# Patient Record
Sex: Female | Born: 1970
Health system: Southern US, Community
[De-identification: ages and names within clinical notes are randomized; demographics above are authoritative.]

## PROBLEM LIST (undated history)

## (undated) DIAGNOSIS — R000 Tachycardia, unspecified: Secondary | ICD-10-CM

## (undated) DIAGNOSIS — K589 Irritable bowel syndrome without diarrhea: Secondary | ICD-10-CM

## (undated) HISTORY — PX: TONSILLECTOMY: SUR1361

## (undated) HISTORY — PX: WISDOM TOOTH EXTRACTION: SHX21

---

## 1998-07-14 ENCOUNTER — Other Ambulatory Visit: Admission: RE | Admit: 1998-07-14 | Discharge: 1998-07-14 | Payer: Self-pay | Admitting: Obstetrics & Gynecology

## 1999-07-20 ENCOUNTER — Other Ambulatory Visit: Admission: RE | Admit: 1999-07-20 | Discharge: 1999-07-20 | Payer: Self-pay | Admitting: Obstetrics & Gynecology

## 1999-10-03 ENCOUNTER — Emergency Department (HOSPITAL_COMMUNITY): Admission: EM | Admit: 1999-10-03 | Discharge: 1999-10-03 | Payer: Self-pay | Admitting: Emergency Medicine

## 2000-08-15 ENCOUNTER — Other Ambulatory Visit: Admission: RE | Admit: 2000-08-15 | Discharge: 2000-08-15 | Payer: Self-pay | Admitting: Obstetrics & Gynecology

## 2000-12-02 ENCOUNTER — Other Ambulatory Visit: Admission: RE | Admit: 2000-12-02 | Discharge: 2000-12-02 | Payer: Self-pay | Admitting: Obstetrics & Gynecology

## 2001-11-28 ENCOUNTER — Ambulatory Visit (HOSPITAL_COMMUNITY): Admission: RE | Admit: 2001-11-28 | Discharge: 2001-11-28 | Payer: Self-pay | Admitting: Obstetrics & Gynecology

## 2001-11-28 ENCOUNTER — Encounter: Payer: Self-pay | Admitting: Obstetrics & Gynecology

## 2001-12-04 ENCOUNTER — Inpatient Hospital Stay (HOSPITAL_COMMUNITY): Admission: AD | Admit: 2001-12-04 | Discharge: 2001-12-04 | Payer: Self-pay | Admitting: Obstetrics & Gynecology

## 2002-05-13 ENCOUNTER — Other Ambulatory Visit: Admission: RE | Admit: 2002-05-13 | Discharge: 2002-05-13 | Payer: Self-pay | Admitting: Obstetrics & Gynecology

## 2002-09-23 ENCOUNTER — Inpatient Hospital Stay (HOSPITAL_COMMUNITY): Admission: AD | Admit: 2002-09-23 | Discharge: 2002-09-26 | Payer: Self-pay | Admitting: Obstetrics & Gynecology

## 2002-09-25 ENCOUNTER — Encounter: Payer: Self-pay | Admitting: Obstetrics and Gynecology

## 2002-09-30 ENCOUNTER — Encounter: Payer: Self-pay | Admitting: Obstetrics & Gynecology

## 2002-09-30 ENCOUNTER — Ambulatory Visit (HOSPITAL_COMMUNITY): Admission: RE | Admit: 2002-09-30 | Discharge: 2002-09-30 | Payer: Self-pay | Admitting: Obstetrics & Gynecology

## 2002-10-06 ENCOUNTER — Ambulatory Visit (HOSPITAL_COMMUNITY): Admission: RE | Admit: 2002-10-06 | Discharge: 2002-10-06 | Payer: Self-pay | Admitting: Obstetrics & Gynecology

## 2002-10-06 ENCOUNTER — Encounter: Payer: Self-pay | Admitting: Obstetrics & Gynecology

## 2002-10-06 ENCOUNTER — Encounter: Admission: RE | Admit: 2002-10-06 | Discharge: 2002-10-06 | Payer: Self-pay | Admitting: Obstetrics & Gynecology

## 2002-10-06 ENCOUNTER — Inpatient Hospital Stay (HOSPITAL_COMMUNITY): Admission: AD | Admit: 2002-10-06 | Discharge: 2002-10-08 | Payer: Self-pay | Admitting: Obstetrics and Gynecology

## 2002-10-08 ENCOUNTER — Encounter: Payer: Self-pay | Admitting: Obstetrics and Gynecology

## 2003-06-02 ENCOUNTER — Other Ambulatory Visit: Admission: RE | Admit: 2003-06-02 | Discharge: 2003-06-02 | Payer: Self-pay | Admitting: Obstetrics & Gynecology

## 2004-06-27 ENCOUNTER — Other Ambulatory Visit: Admission: RE | Admit: 2004-06-27 | Discharge: 2004-06-27 | Payer: Self-pay | Admitting: Obstetrics & Gynecology

## 2006-11-26 ENCOUNTER — Encounter: Admission: RE | Admit: 2006-11-26 | Discharge: 2006-11-26 | Payer: Self-pay | Admitting: Gastroenterology

## 2009-06-09 ENCOUNTER — Encounter: Admission: RE | Admit: 2009-06-09 | Discharge: 2009-06-09 | Payer: Self-pay | Admitting: Obstetrics & Gynecology

## 2009-06-19 ENCOUNTER — Emergency Department (HOSPITAL_COMMUNITY): Admission: EM | Admit: 2009-06-19 | Discharge: 2009-06-19 | Payer: Self-pay | Admitting: Emergency Medicine

## 2009-06-19 ENCOUNTER — Encounter: Payer: Self-pay | Admitting: Internal Medicine

## 2009-08-01 DIAGNOSIS — I471 Supraventricular tachycardia, unspecified: Secondary | ICD-10-CM | POA: Insufficient documentation

## 2009-08-02 ENCOUNTER — Ambulatory Visit: Payer: Self-pay | Admitting: Internal Medicine

## 2010-05-30 NOTE — Assessment & Plan Note (Signed)
Summary: nep/svt/eval for ablation   Referring Provider:  Gordy Levan  CC:  new patient/SVT/evaluation for ablation.  Pt states she is feeling well and has no complaints at this time.Alexis Herman  History of Present Illness: Mrs. Langelier is seen at the request of Dr. Anne Fu for recurrent supraventricular tachycardia. These episodes date back 10-15 years. They're abrupt in onset and offset and are associated with shortness of breath and lightheadedness primarily related to her long duration. They can last up to 10 hours. They are interestingly frog negative and diuretic negative. There are no obvious precipitanting activities or factors. She notes no effect from caffeine. She is trichrome sinus massage and other vagal maneuvers without success.  Evaluation has included an echo that was norma; l emergency room records and Dr. Jayme Cloud his records were reviewed.  Current Medications (verified): 1)  None  Allergies (verified): No Known Drug Allergies  Past History:  Social History: Last updated: 08/02/2009 Tobacco Use - No.  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 HEENT  normal .  Neck veins were flat; carotids brisk and full without bruits. No lymphadenopathy. Back without kyphosis. Lungs clear. Heart sounds regular without murmurs or gallops. it was a split S1.PMI nondisplaced. Abdomen soft with active bowel sounds without midline pulsation or hepatomegaly. Femoral pulses and distal pulses intact. Extremities were without clubbing cyanosis or edemaSkin warm and dry. affect is normalNeurological exam grossly normal    EKG  Procedure date:  08/02/2009  Findings:      sinus rhythm intervals 0.12/0.07/0.37 no delta wave Intervals 0.03/2006/23 7 Axis of 70  Impression & Recommendations:  Problem # 1:  SUPRAVENTRICULAR TACHYCARDIA (ICD-427.89) The patient has supraventricular tachycardia, probably AV node reentry based on her cardiogram and gender although her symptoms are not consistent with that. We have discussed treatment options including p.r.n. versus daily AV nodal blocking agents, and catheter ablation. We discussed potential benefits of the former as well as her concerns with her low blood pressure and we discussed the potential benefits and risks of catheter ablation including but not limited to death perforation stroke heart block requiring pacemaker implantation. She understands these risks and she will be considering what it is that she would like to do.  Like to look at the echo cardiogram to see what the explanation is upper loud split first heart sound Orders: EKG w/ Interpretation (93000)

## 2010-07-19 LAB — URINALYSIS, ROUTINE W REFLEX MICROSCOPIC
Bilirubin Urine: NEGATIVE
Hgb urine dipstick: NEGATIVE
Nitrite: NEGATIVE
Protein, ur: NEGATIVE mg/dL
Specific Gravity, Urine: 1.012 (ref 1.005–1.030)
Urobilinogen, UA: 0.2 mg/dL (ref 0.0–1.0)

## 2010-07-19 LAB — RAPID URINE DRUG SCREEN, HOSP PERFORMED
Amphetamines: NOT DETECTED
Opiates: NOT DETECTED
Tetrahydrocannabinol: NOT DETECTED

## 2010-07-19 LAB — POCT I-STAT, CHEM 8
Chloride: 106 mEq/L (ref 96–112)
HCT: 42 % (ref 36.0–46.0)
Hemoglobin: 14.3 g/dL (ref 12.0–15.0)
Potassium: 3.7 mEq/L (ref 3.5–5.1)
Sodium: 138 mEq/L (ref 135–145)

## 2010-07-19 LAB — POCT PREGNANCY, URINE: Preg Test, Ur: NEGATIVE

## 2010-09-15 NOTE — Discharge Summary (Signed)
NAME:  Alexis Herman, Alexis Herman                     ACCOUNT NO.:  [id number]   MEDICAL RECORD NO.:  [id number]                   PATIENT TYPE:  INP   LOCATION:  9159                                 FACILITY:  [facility]   PHYSICIAN:  Miguel Aschoff, M.D.                    DATE OF BIRTH:  [birth year]   DATE OF ADMISSION:  09/23/2002  DATE OF DISCHARGE:  09/26/2002                                 DISCHARGE SUMMARY   ADMISSION DIAGNOSIS:  Intrauterine twin pregnancy at 29 weeks with  threatened preterm labor.   FINAL DIAGNOSIS:  Intrauterine twin pregnancy at 29 weeks with threatened  preterm labor.   PROCEDURES:  Magnesium sulfate tocolytic therapy, betamethasone  administration.   OTHER DIAGNOSIS:  Discordant fetal growth.   BRIEF HISTORY:  The patient is a 40 year old white female, G1, P0, with an  estimated date of confinement of 12/09/02.  The patient conceived this twin  gestation by in vitro fertilization and had been doing well until she  developed the onset of uterine contractions.  She was evaluated on 09/24/02  and was found to be having contractions every 3-4 minutes and was admitted  to the hospital to undergo tocolytic therapy.  The patient was started on  magnesium sulfate 2.5 gm per hour.  She was placed on Unasyn.  Group B strep  was obtained.  The patient did have arrest of her contractions on 2.5 gm of  magnesium sulfate and was eventually weaned off the magnesium sulfate and  changed to oral Procardia that required 20 mg q.8h. to keep her contractions  suppressed.   Laboratory studies while in the hospital revealed a negative group B strep  probe.  Fetal fibronectin was negative.  An obstetric ultrasound was carried  out which revealed a twin gestation with baby A presenting as a breech with  an estimated fetal weight of 1246 gm.  Fetus B5 was also presenting as a  breech with an estimated fetal weight of 1664 gm.  It was noted that this  was consistent with discordant growth.   Other parameters appear to be within  normal limits.  The patient's physical exam revealed a cervical length of  3.4 cm on ultrasound, and appeared to be closed on pelvic examination.   With cessation of her contractions with oral Procardia, she was stable  enough to be discharged home on 09/26/02.  She is to have her next  appointment on Wednesday, 09/30/02.  She is to call if there are any problems  such as fever, pain, rupture of membranes, or more than six contractions per  hour.  The patient will require subsequent fetal ultrasound studies and  Doppler studies to ensure that the discordant growth is not causing any  impairment to the smaller of the two fetuses.  Miguel Aschoff, M.D.    AR/MEDQ  D:  09/26/2002  T:  09/26/2002  Job:  [id number]

## 2010-09-15 NOTE — H&P (Signed)
[place]  Patient:    Alexis Herman, Alexis Herman                   MRN: [id number] Adm. Date:  [id number] Attending:  Osvaldo Human Dictator:   Anselm Lis, N.P. CC:         Miguel Aschoff, M.D./[employer] [facility]             Francisca December, M.D./in clinic                         History and Physical  HISTORY OF PRESENT ILLNESS:  Ms. Wilhemina Herman is a very pleasant 40 year old without  significant previous medical history, who developed episodes of rapid heart rate with associated light-headedness, first noted three months earlier.  The episodes usually lasted ten minutes; resolved spontaneously.  No particular pattern or precipitating factors identified - initially occurring about every one to two weeks and now occurring on a daily basis.  After lunch today, she had an occurrence that was sustained; she had associated  dizziness with some mild anterior chest pressure with exhalation noted.  She presented to [facility] emergency room where her heart rate was noted to be 150. She was given IV Adenosine - without breaking the SVT; actually, it increased to approximately 160s.  Decreased rate to sinus tach after 20 mg IV Cardizem - rate approximately 110.  Subsequently, heart rate maintaining 80s after IV Lopressor 5 mg.  EKG without ischemic changes.  iSTAT ___ significant for potassium of 3.3;  supplemented with 40 mEq of K-Dur.  The patient did have a Holter monitor placed approximately two weeks earlier; had one mild episode at that time - tape is not yet turned in.  PAST MEDICAL HISTORY: 1. Palpitations, as above. 2. Tonsillectomy, repair of deviated nasal septum, sinus surgery - in 1991. 3. History of colitis.  Denies history of: hypertension, diabetes mellitus, thyroid disease or cancer.  ALLERGIES:  No known drug allergies; no problems with seafood, shellfish nor iodine products.  MEDICATIONS:  Ortho Tri-Cyclen.  SOCIAL  HISTORY/HABITS:  Married for nine months; no children.  Tobacco: Negative. ETOH: Rare social.  Caffeine:  Decaf tea at home and decaffeinated soda at lunch. The patient works at [employer], Education officer, museum [employer]  FAMILY HISTORY:  Father age 42; mother age 93 - both alive and well.  One sister and one brother in good health.  REVIEW OF SYSTEMS:  As in HPI/Previous Medical History; otherwise - essentially  benign.  No complaint of undue headaches, tinnitus, dysphagia, melena nor constipation.  Episodic diarrhea.  Denies DOE, though does note shortness of breath with climbing long stairs at her new job function.  Negative pedal edema, orthopnea, PND nor undue shortness of breath.  PHYSICAL EXAMINATION:  GENERAL:  A well-nourished, pleasantly conversant young female in no apparent distress.  Husband in attendance.  VITAL SIGNS:  Blood pressure 116/80, heart rate (initially 149) currently in the 80s/sinus tach after IV Lopressor; saturations 100%  HEAD, EARS, EYES, NOSE AND THROAT:  Brisk bilateral carotid upstroke, without bruit.  NECK:  No significant JVD or thyromegaly.  CHEST:  Lung sounds clear with equal bilateral excursion.  CARDIAC:  Regular rate and rhythm without murmur, rub nor gallop; split S1 and normal S2.  ABDOMEN:  Soft, nondistended; normoactive bowel sounds.  Negative abdominal aorta, renal nor femoral bruit.  No masses, no organomegaly appreciated.  EXTREMITIES:  +2/4 bilateral radial,  femoral, dorsalis pedis and posterior tibial pulses.  Negative pedal edema.  NEUROLOGIC:  Cranial nerves II-XII grossly intact; alert and oriented x 3.  GENITO/RECTAL:  Exams deferred.  LABORATORY TESTS:  Today - iSTAT ___:  sodium 146, K 3.3 (supplemented), BUN 9,  glucose 86.  Hemoglobin 14, hematocrit 40.  EKG:  Initial supraventricular tachycardia at 149 beats/minute; subsequently sinus rhythm in the 80s after a total of IV Cardizem 20 mg and IV  Lopressor 5 mg.  IMPRESSION:  Supraventricular tachycardia; probable AV nodal re-entry in this 40 year old of benign previous medical history.  She now is in normal sinus rhythm with good rate control after intravenous calcium channel blocker and intravenous beta-blocker.  PLAN: 1. Will discharge from emergency room on Toprol 50 mg p.o. q.d. 2. Have scheduled for outpatient 2D-echocardiogram this Thursday 10/05/99 at 2 .m.    to evaluate for structural abnormalities. 3. Will follow-up TSH results, drawn here in the ER and which are pending. 4. Follow-up results of event monitor tape. 5. Patient has been counseled to avoid caffeinated beverages, alcoholic beverages    nor over-the-counter decongestant. DD:  10/03/99 TD:  10/03/99 Job: [id number]

## 2011-05-08 ENCOUNTER — Other Ambulatory Visit: Payer: Self-pay | Admitting: Obstetrics & Gynecology

## 2011-05-08 DIAGNOSIS — Z1231 Encounter for screening mammogram for malignant neoplasm of breast: Secondary | ICD-10-CM

## 2011-06-04 ENCOUNTER — Ambulatory Visit
Admission: RE | Admit: 2011-06-04 | Discharge: 2011-06-04 | Disposition: A | Discharge status: Home / Self Care | Payer: 59 | Source: Ambulatory Visit | Attending: Obstetrics & Gynecology | Admitting: Obstetrics & Gynecology

## 2011-06-04 DIAGNOSIS — Z1231 Encounter for screening mammogram for malignant neoplasm of breast: Secondary | ICD-10-CM

## 2011-06-11 ENCOUNTER — Other Ambulatory Visit: Payer: Self-pay | Admitting: Obstetrics & Gynecology

## 2011-06-11 DIAGNOSIS — R928 Other abnormal and inconclusive findings on diagnostic imaging of breast: Secondary | ICD-10-CM

## 2011-06-15 ENCOUNTER — Ambulatory Visit
Admission: RE | Admit: 2011-06-15 | Discharge: 2011-06-15 | Disposition: A | Discharge status: Home / Self Care | Payer: 59 | Source: Ambulatory Visit | Attending: Obstetrics & Gynecology | Admitting: Obstetrics & Gynecology

## 2011-06-15 DIAGNOSIS — R928 Other abnormal and inconclusive findings on diagnostic imaging of breast: Secondary | ICD-10-CM

## 2011-11-06 ENCOUNTER — Other Ambulatory Visit: Payer: Self-pay | Admitting: Obstetrics & Gynecology

## 2011-11-06 DIAGNOSIS — N6009 Solitary cyst of unspecified breast: Secondary | ICD-10-CM

## 2011-12-14 ENCOUNTER — Other Ambulatory Visit: Payer: 59

## 2011-12-17 ENCOUNTER — Ambulatory Visit
Admission: RE | Admit: 2011-12-17 | Discharge: 2011-12-17 | Disposition: A | Discharge status: Home / Self Care | Payer: 59 | Source: Ambulatory Visit | Attending: Obstetrics & Gynecology | Admitting: Obstetrics & Gynecology

## 2011-12-17 DIAGNOSIS — N6009 Solitary cyst of unspecified breast: Secondary | ICD-10-CM

## 2012-03-07 ENCOUNTER — Emergency Department
Admission: EM | Admit: 2012-03-07 | Discharge: 2012-03-07 | Disposition: A | Discharge status: Home / Self Care | Payer: Self-pay | Source: Home / Self Care

## 2012-03-07 ENCOUNTER — Encounter: Payer: Self-pay | Admitting: *Deleted

## 2012-03-07 ENCOUNTER — Emergency Department (INDEPENDENT_AMBULATORY_CARE_PROVIDER_SITE_OTHER): Payer: 59

## 2012-03-07 DIAGNOSIS — J40 Bronchitis, not specified as acute or chronic: Secondary | ICD-10-CM

## 2012-03-07 DIAGNOSIS — R059 Cough, unspecified: Secondary | ICD-10-CM

## 2012-03-07 DIAGNOSIS — R05 Cough: Secondary | ICD-10-CM

## 2012-03-07 DIAGNOSIS — R062 Wheezing: Secondary | ICD-10-CM

## 2012-03-07 DIAGNOSIS — J069 Acute upper respiratory infection, unspecified: Secondary | ICD-10-CM

## 2012-03-07 MED ORDER — AZITHROMYCIN 250 MG PO TABS: ORAL_TABLET | ORAL | Status: DC

## 2012-03-07 MED ORDER — HYDROCOD POLST-CHLORPHEN POLST 10-8 MG/5ML PO LQCR: 5.0000 mL | Freq: Two times a day (BID) | ORAL | Status: DC | PRN

## 2012-03-07 NOTE — ED Notes (Signed)
Patient reports URI last week. Unable to rid herself of the cough. Productive at times. Denies fever. Cough is worse at night.

## 2012-03-07 NOTE — ED Provider Notes (Signed)
History     CSN: [id number]  Arrival date & time 03/07/12  0847   First MD Initiated Contact with Patient 03/07/12 0914      Chief Complaint  Patient presents with  . Cough   HPI URI Symptoms Onset: 10 days  Description: initially with URI sxs including rhinorrhea, nasal congestion, sore throat and cough. Cough and drainage has persisted Modifying factors:  Also with mild wheezing and SOB. No known prior hx/o asthma   Symptoms Nasal discharge: yes Fever: no Sore throat: no Cough: yes Wheezing: yes Ear pain: n GI symptoms: no Sick contacts: yes  Red Flags  Stiff neck: no Dyspnea: mild Rash: no Swallowing difficulty: no  Sinusitis Risk Factors Headache/face pain: no Double sickening: no tooth pain: no  Allergy Risk Factors Sneezing: no Itchy scratchy throat: no Seasonal symptoms: yes  Flu Risk Factors Headache: no muscle aches: no severe fatigue: no   History reviewed. No pertinent past medical history.  Past Surgical History  Procedure Date  . Cesarean section   . Tonsillectomy     Family History  Problem Relation Age of Onset  . Cancer Mother     breast  . COPD Father     History  Substance Use Topics  . Smoking status: Never Smoker   . Smokeless tobacco: Not on file  . Alcohol Use: No    OB History    Grav Para Term Preterm Abortions TAB SAB Ect Mult Living                  Review of Systems  All other systems reviewed and are negative.    Allergies  Review of patient's allergies indicates no known allergies.  Home Medications   Current Outpatient Rx  Name  Route  Sig  Dispense  Refill  . AZITHROMYCIN 250 MG PO TABS      Take 2 tabs PO x 1 dose, then 1 tab PO QD x 4 days   6 tablet   0   . HYDROCOD POLST-CPM POLST ER 10-8 MG/5ML PO LQCR   Oral   Take 5 mLs by mouth every 12 (twelve) hours as needed.   140 mL   0     BP 110/69  Pulse 70  Temp 98.1 F (36.7 C) (Oral)  Resp 14  Ht 5\' 4"  (1.626 m)  Wt 143 lb  (64.864 kg)  BMI 24.55 kg/m2  SpO2 100%  LMP 02/21/2012  Physical Exam  Constitutional: She appears well-developed and well-nourished.  HENT:  Head: Normocephalic and atraumatic.  Right Ear: External ear normal.  Left Ear: External ear normal.       +nasal erythema, rhinorrhea bilaterally, + post oropharyngeal erythema    Eyes: Conjunctivae normal are normal. Pupils are equal, round, and reactive to light.  Neck: Normal range of motion. Neck supple.  Cardiovascular: Normal rate, regular rhythm and normal heart sounds.   Pulmonary/Chest: Effort normal and breath sounds normal. She has no wheezes.  Abdominal: Soft.  Musculoskeletal: Normal range of motion.  Lymphadenopathy:    She has no cervical adenopathy.  Neurological: She is alert.  Skin: Skin is warm.    ED Course  Procedures (including critical care time)  Labs Reviewed - No data to display Dg Chest 2 View  03/07/2012  *RADIOLOGY REPORT*  Clinical Data: Cough.  CHEST - 2 VIEW  Comparison: None  Findings: The cardiac silhouette, mediastinal and hilar contours are within normal limits.  Mild peribronchial thickening and slight hyperinflation could  suggest bronchitis or reactive airways disease.  No focal infiltrates or pleural effusion.  The bony thorax is intact.  IMPRESSION: Mild bronchitic type lung changes and slight hyperinflation could suggest bronchitis or reactive airways disease.   Original Report Authenticated By: Rudie Meyer, M.D.      1. URI (upper respiratory infection)   2. Bronchitis   3. Wheezing       MDM  Solumedrol 125mg  IM x1 in setting of wheezing.  Zpak for atypical coverage.  Tussionex for cough.  Zyrtec for allergic component.  Discussed resp and infectious red flags for reevaluation.  Follow up with PCP next week for general reevaluation.  Pt would benefit from formal PFTs 4-6 after resolution of sxs.     The patient and/or caregiver has been counseled thoroughly with regard to  treatment plan and/or medications prescribed including dosage, schedule, interactions, rationale for use, and possible side effects and they verbalize understanding. Diagnoses and expected course of recovery discussed and will return if not improved as expected or if the condition worsens. Patient and/or caregiver verbalized understanding.             Doree Albee, MD 03/07/12 1006

## 2012-05-19 ENCOUNTER — Other Ambulatory Visit: Payer: Self-pay | Admitting: Obstetrics & Gynecology

## 2012-05-19 DIAGNOSIS — Z1231 Encounter for screening mammogram for malignant neoplasm of breast: Secondary | ICD-10-CM

## 2012-06-11 ENCOUNTER — Ambulatory Visit: Payer: 59

## 2012-06-18 ENCOUNTER — Ambulatory Visit: Payer: 59

## 2012-06-25 ENCOUNTER — Ambulatory Visit
Admission: RE | Admit: 2012-06-25 | Discharge: 2012-06-25 | Disposition: A | Discharge status: Home / Self Care | Payer: BC Managed Care – PPO | Source: Ambulatory Visit | Attending: Obstetrics & Gynecology | Admitting: Obstetrics & Gynecology

## 2012-06-25 DIAGNOSIS — Z1231 Encounter for screening mammogram for malignant neoplasm of breast: Secondary | ICD-10-CM

## 2012-10-26 ENCOUNTER — Emergency Department (HOSPITAL_BASED_OUTPATIENT_CLINIC_OR_DEPARTMENT_OTHER): Payer: BC Managed Care – PPO

## 2012-10-26 ENCOUNTER — Encounter (HOSPITAL_BASED_OUTPATIENT_CLINIC_OR_DEPARTMENT_OTHER): Payer: Self-pay

## 2012-10-26 ENCOUNTER — Emergency Department (HOSPITAL_BASED_OUTPATIENT_CLINIC_OR_DEPARTMENT_OTHER)
Admission: EM | Admit: 2012-10-26 | Discharge: 2012-10-27 | Disposition: A | Discharge status: Home / Self Care | Payer: BC Managed Care – PPO | Attending: Emergency Medicine | Admitting: Emergency Medicine

## 2012-10-26 DIAGNOSIS — Z8679 Personal history of other diseases of the circulatory system: Secondary | ICD-10-CM | POA: Insufficient documentation

## 2012-10-26 DIAGNOSIS — R109 Unspecified abdominal pain: Secondary | ICD-10-CM

## 2012-10-26 DIAGNOSIS — R1084 Generalized abdominal pain: Secondary | ICD-10-CM | POA: Insufficient documentation

## 2012-10-26 DIAGNOSIS — Z3202 Encounter for pregnancy test, result negative: Secondary | ICD-10-CM | POA: Insufficient documentation

## 2012-10-26 HISTORY — DX: Tachycardia, unspecified: R00.0

## 2012-10-26 HISTORY — DX: Irritable bowel syndrome, unspecified: K58.9

## 2012-10-26 LAB — URINALYSIS, ROUTINE W REFLEX MICROSCOPIC
Bilirubin Urine: NEGATIVE
Leukocytes, UA: NEGATIVE
Nitrite: NEGATIVE
Specific Gravity, Urine: 1.014 (ref 1.005–1.030)
Urobilinogen, UA: 0.2 mg/dL (ref 0.0–1.0)
pH: 5.5 (ref 5.0–8.0)

## 2012-10-26 LAB — BASIC METABOLIC PANEL
Calcium: 9.6 mg/dL (ref 8.4–10.5)
GFR calc Af Amer: >90 mL/min (ref >90)
GFR calc non Af Amer: >90 mL/min (ref >90)
Glucose, Bld: 86 mg/dL (ref 70–99)
Potassium: 3.5 mEq/L (ref 3.5–5.1)
Sodium: 138 mEq/L (ref 135–145)

## 2012-10-26 LAB — CBC
MCH: 30.3 pg (ref 26.0–34.0)
Platelets: 232 10*3/uL (ref 150–400)
RBC: 4.29 MIL/uL (ref 3.87–5.11)

## 2012-10-26 LAB — PREGNANCY, URINE: Preg Test, Ur: NEGATIVE

## 2012-10-26 MED ORDER — IOHEXOL 300 MG/ML  SOLN
50.0000 mL | Freq: Once | INTRAMUSCULAR | Status: AC | PRN
  Administered 2012-10-26: 50 mL via ORAL

## 2012-10-26 NOTE — ED Notes (Signed)
Patient here with generalized abdominal pain since Friday. Originally thought the pain was cramps but now the pain is more constant and today bloated. Last bowel movement yesterday and no nausea. Has IBS but reports that this pain is different

## 2012-10-26 NOTE — ED Provider Notes (Signed)
History    This chart was scribed for Rolan Bucco, MD, MD by Ashley Jacobs, ED Scribe. The patient was seen in room MH05/MH05 and the patient's care was started at 9:52 PM  CSN: [id number] Arrival date & time 10/26/12  2047    Chief Complaint  Patient presents with  . Abdominal Pain    The history is provided by the patient and medical records. No language interpreter was used.   HPI Comments: Alexis Herman is a 42 y.o. female who presents to the Emergency Department complaining of moderate, constant abdominal pain for the past 3 days. Pt reports that symptoms began 3-day ago and most likely due to IBS symptoms or menstrual period with a general "ache" feeling  across her abdomen and feeling "full" despite not having an appetite. Pt reports by day two the pain was worsening and localized to the lower abdomen and persist to arrival. Pt reports that pain worsen by movement and is progressively worsen throughout the day. She recalls having small BM this morning and gas throughout today. Pt has an hx of c-section in 2004. She reports not seeing a GI physician recently. Pt denies fever, chills, nausea, vomiting, diarrhea, weakness, cough, SOB and any other pain.   Past Medical History  Diagnosis Date  . Irritable bowel syndrome (IBS)   . Tachycardia    Past Surgical History  Procedure Laterality Date  . Cesarean section    . Tonsillectomy     Family History  Problem Relation Age of Onset  . Cancer Mother     breast  . COPD Father    History  Substance Use Topics  . Smoking status: Never Smoker   . Smokeless tobacco: Not on file  . Alcohol Use: No   OB History   Grav Para Term Preterm Abortions TAB SAB Ect Mult Living                 Review of Systems  Gastrointestinal: Positive for abdominal pain.    Allergies  Review of patient's allergies indicates no known allergies.  Home Medications  No current outpatient prescriptions on file. BP 116/72  Pulse 92   Temp(Src) 98.5 F (36.9 C) (Oral)  Resp 16  Ht 5\' 4"  (1.626 m)  Wt 135 lb (61.236 kg)  BMI 23.16 kg/m2  SpO2 100%  LMP 10/24/2012  Physical Exam  Nursing note and vitals reviewed. Constitutional: She is oriented to person, place, and time. She appears well-developed and well-nourished. No distress.  HENT:  Head: Normocephalic and atraumatic.  Eyes: EOM are normal. Pupils are equal, round, and reactive to light.  Neck: Normal range of motion. Neck supple. No tracheal deviation present.  Cardiovascular: Normal rate, regular rhythm and normal heart sounds.   Pulmonary/Chest: Effort normal and breath sounds normal. No respiratory distress. She has no wheezes. She has no rales. She exhibits no tenderness.  Abdominal: Soft. Bowel sounds are normal. There is no rebound and no guarding.  moderate diffuse abdominal tenderness Worse in the lower right quaderant No CVA tenderness  Musculoskeletal: Normal range of motion. She exhibits no edema.  Lymphadenopathy:    She has no cervical adenopathy.  Neurological: She is alert and oriented to person, place, and time.  Skin: Skin is warm and dry. No rash noted.  Psychiatric: She has a normal mood and affect. Her behavior is normal.    ED Course  Procedures (including critical care time) DIAGNOSTIC STUDIES: Oxygen Saturation is 100% on room air, normal by my  interpretation.    COORDINATION OF CARE: 9:58 PM Discussed ED treatment with pt and pt agrees.    Results for orders placed during the hospital encounter of 10/26/12  URINALYSIS, ROUTINE W REFLEX MICROSCOPIC      Result Value Range   Color, Urine YELLOW  YELLOW   APPearance CLEAR  CLEAR   Specific Gravity, Urine 1.014  1.005 - 1.030   pH 5.5  5.0 - 8.0   Glucose, UA NEGATIVE  NEGATIVE mg/dL   Hgb urine dipstick NEGATIVE  NEGATIVE   Bilirubin Urine NEGATIVE  NEGATIVE   Ketones, ur NEGATIVE  NEGATIVE mg/dL   Protein, ur NEGATIVE  NEGATIVE mg/dL   Urobilinogen, UA 0.2  0.0 - 1.0  mg/dL   Nitrite NEGATIVE  NEGATIVE   Leukocytes, UA NEGATIVE  NEGATIVE  PREGNANCY, URINE      Result Value Range   Preg Test, Ur NEGATIVE  NEGATIVE  BASIC METABOLIC PANEL      Result Value Range   Sodium 138  135 - 145 mEq/L   Potassium 3.5  3.5 - 5.1 mEq/L   Chloride 102  96 - 112 mEq/L   CO2 28  19 - 32 mEq/L   Glucose, Bld 86  70 - 99 mg/dL   BUN 12  6 - 23 mg/dL   Creatinine, Ser 1.61  0.50 - 1.10 mg/dL   Calcium 9.6  8.4 - 09.6 mg/dL   GFR calc non Af Amer >90  >90 mL/min   GFR calc Af Amer >90  >90 mL/min  CBC      Result Value Range   WBC 5.0  4.0 - 10.5 K/uL   RBC 4.29  3.87 - 5.11 MIL/uL   Hemoglobin 13.0  12.0 - 15.0 g/dL   HCT 04.5  40.9 - 81.1 %   MCV 88.6  78.0 - 100.0 fL   MCH 30.3  26.0 - 34.0 pg   MCHC 34.2  30.0 - 36.0 g/dL   RDW 91.4  78.2 - 95.6 %   Platelets 232  150 - 400 K/uL   No results found.   No results found. No diagnosis found.  MDM  PT is awaiting CT.  Will turn over to DR Palumbo pending CT  I personally performed the services described in this documentation, which was scribed in my presence.  The recorded information has been reviewed and considered.    Rolan Bucco, MD 10/27/12 0000

## 2012-10-27 ENCOUNTER — Encounter (HOSPITAL_BASED_OUTPATIENT_CLINIC_OR_DEPARTMENT_OTHER): Payer: Self-pay

## 2012-10-27 LAB — HEPATIC FUNCTION PANEL
ALT: 22 U/L (ref 0–35)
Albumin: 3.7 g/dL (ref 3.5–5.2)
Total Protein: 7 g/dL (ref 6.0–8.3)

## 2012-10-27 LAB — LIPASE, BLOOD: Lipase: 38 U/L (ref 11–59)

## 2012-10-27 MED ORDER — IOHEXOL 300 MG/ML  SOLN
100.0000 mL | Freq: Once | INTRAMUSCULAR | Status: AC | PRN
  Administered 2012-10-27: 100 mL via INTRAVENOUS

## 2012-10-27 MED ORDER — TRAMADOL HCL 50 MG PO TABS: 50.0000 mg | ORAL_TABLET | Freq: Four times a day (QID) | ORAL | Status: DC | PRN

## 2012-10-27 NOTE — ED Notes (Signed)
Patient transported to CT 

## 2012-10-27 NOTE — ED Notes (Signed)
rx x 1 given for tramadol

## 2013-05-21 ENCOUNTER — Other Ambulatory Visit: Payer: Self-pay

## 2013-05-21 DIAGNOSIS — Z1231 Encounter for screening mammogram for malignant neoplasm of breast: Secondary | ICD-10-CM

## 2013-06-26 ENCOUNTER — Ambulatory Visit: Payer: 59

## 2013-07-08 ENCOUNTER — Ambulatory Visit
Admission: RE | Admit: 2013-07-08 | Discharge: 2013-07-08 | Disposition: A | Discharge status: Home / Self Care | Payer: BC Managed Care – PPO | Source: Ambulatory Visit

## 2013-07-08 DIAGNOSIS — Z1231 Encounter for screening mammogram for malignant neoplasm of breast: Secondary | ICD-10-CM

## 2014-05-14 ENCOUNTER — Other Ambulatory Visit: Payer: Self-pay | Admitting: Obstetrics & Gynecology

## 2014-05-27 ENCOUNTER — Encounter (HOSPITAL_COMMUNITY): Payer: Self-pay

## 2014-05-27 ENCOUNTER — Encounter (HOSPITAL_COMMUNITY)
Admission: RE | Admit: 2014-05-27 | Discharge: 2014-05-27 | Disposition: A | Discharge status: Home / Self Care | Payer: BLUE CROSS/BLUE SHIELD | Source: Ambulatory Visit | Attending: Obstetrics & Gynecology | Admitting: Obstetrics & Gynecology

## 2014-05-27 DIAGNOSIS — Z01818 Encounter for other preprocedural examination: Secondary | ICD-10-CM | POA: Insufficient documentation

## 2014-05-27 DIAGNOSIS — R102 Pelvic and perineal pain: Secondary | ICD-10-CM | POA: Diagnosis not present

## 2014-05-27 LAB — CBC
HEMATOCRIT: 40.6 % (ref 36.0–46.0)
Hemoglobin: 13.5 g/dL (ref 12.0–15.0)
MCH: 29.6 pg (ref 26.0–34.0)
MCHC: 33.3 g/dL (ref 30.0–36.0)
MCV: 89 fL (ref 78.0–100.0)
PLATELETS: 223 10*3/uL (ref 150–400)
RBC: 4.56 MIL/uL (ref 3.87–5.11)
RDW: 12.3 % (ref 11.5–15.5)
WBC: 4.3 10*3/uL (ref 4.0–10.5)

## 2014-05-27 NOTE — Patient Instructions (Addendum)
   Your procedure is scheduled on:  Tuesday, Feb 2  [facility] at: 9:45 AM Pick up the phone at the desk and dial [phone] and inform us of your arrival.  Please call this number if you have any problems the morning of surgery: [phone]  Remember: Do not eat or drink after midnight: Monday Take these medicines the morning of surgery with a SIP OF WATER:  None  Do not wear jewelry, make-up, or FINGER nail polish No metal in your hair or on your body. Do not wear lotions, powders, perfumes.  You may wear deodorant.  Do not bring valuables to the hospital. Contacts, dentures or bridgework may not be worn into surgery.   Patients discharged on the day of surgery will not be allowed to drive home.  Home with husband Lloyd Hugereil cell [phone]

## 2014-06-01 ENCOUNTER — Ambulatory Visit (HOSPITAL_COMMUNITY): Payer: BLUE CROSS/BLUE SHIELD | Admitting: Anesthesiology

## 2014-06-01 ENCOUNTER — Encounter (HOSPITAL_COMMUNITY)
Admission: RE | Disposition: A | Discharge status: Home / Self Care | Payer: Self-pay | Source: Ambulatory Visit | Attending: Obstetrics & Gynecology

## 2014-06-01 ENCOUNTER — Ambulatory Visit (HOSPITAL_COMMUNITY)
Admission: RE | Admit: 2014-06-01 | Discharge: 2014-06-01 | Disposition: A | Discharge status: Home / Self Care | Payer: BLUE CROSS/BLUE SHIELD | Source: Ambulatory Visit | Attending: Obstetrics & Gynecology | Admitting: Obstetrics & Gynecology

## 2014-06-01 DIAGNOSIS — K589 Irritable bowel syndrome without diarrhea: Secondary | ICD-10-CM | POA: Insufficient documentation

## 2014-06-01 DIAGNOSIS — Z79899 Other long term (current) drug therapy: Secondary | ICD-10-CM | POA: Insufficient documentation

## 2014-06-01 DIAGNOSIS — N946 Dysmenorrhea, unspecified: Secondary | ICD-10-CM | POA: Diagnosis present

## 2014-06-01 HISTORY — PX: LAPAROSCOPY: SHX197

## 2014-06-01 LAB — PREGNANCY, URINE: PREG TEST UR: NEGATIVE

## 2014-06-01 SURGERY — LAPAROSCOPY OPERATIVE
Anesthesia: General | Site: Abdomen

## 2014-06-01 MED ORDER — KETOROLAC TROMETHAMINE 30 MG/ML IJ SOLN: 30.0000 mg | Freq: Once | INTRAMUSCULAR | Status: DC

## 2014-06-01 MED ORDER — OXYCODONE-ACETAMINOPHEN 5-325 MG PO TABS: 1.0000 | ORAL_TABLET | ORAL | Status: DC | PRN

## 2014-06-01 MED ORDER — FENTANYL CITRATE 0.05 MG/ML IJ SOLN
INTRAMUSCULAR | Status: AC
  Filled 2014-06-01: qty 5

## 2014-06-01 MED ORDER — IBUPROFEN 800 MG PO TABS
800.0000 mg | ORAL_TABLET | Freq: Three times a day (TID) | ORAL | Status: DC | PRN
  Administered 2014-06-01: 600 mg via ORAL

## 2014-06-01 MED ORDER — PROPOFOL 10 MG/ML IV BOLUS
INTRAVENOUS | Status: DC | PRN
  Administered 2014-06-01: 180 mg via INTRAVENOUS

## 2014-06-01 MED ORDER — MIDAZOLAM HCL 2 MG/2ML IJ SOLN
INTRAMUSCULAR | Status: AC
  Filled 2014-06-01: qty 2

## 2014-06-01 MED ORDER — HEPARIN SODIUM (PORCINE) 5000 UNIT/ML IJ SOLN
INTRAMUSCULAR | Status: AC
  Filled 2014-06-01: qty 1

## 2014-06-01 MED ORDER — LACTATED RINGERS IV SOLN
INTRAVENOUS | Status: DC
  Administered 2014-06-01 (×2): via INTRAVENOUS

## 2014-06-01 MED ORDER — DEXAMETHASONE SODIUM PHOSPHATE 10 MG/ML IJ SOLN
INTRAMUSCULAR | Status: DC | PRN
  Administered 2014-06-01: 4 mg via INTRAVENOUS

## 2014-06-01 MED ORDER — LIDOCAINE HCL (CARDIAC) 20 MG/ML IV SOLN
INTRAVENOUS | Status: DC | PRN
  Administered 2014-06-01: 50 mg via INTRAVENOUS

## 2014-06-01 MED ORDER — SUCCINYLCHOLINE CHLORIDE 20 MG/ML IJ SOLN
INTRAMUSCULAR | Status: DC | PRN
  Administered 2014-06-01: 100 mg via INTRAVENOUS

## 2014-06-01 MED ORDER — LIDOCAINE HCL (CARDIAC) 20 MG/ML IV SOLN
INTRAVENOUS | Status: AC
  Filled 2014-06-01: qty 5

## 2014-06-01 MED ORDER — LACTATED RINGERS IV SOLN: INTRAVENOUS | Status: DC

## 2014-06-01 MED ORDER — MIDAZOLAM HCL 2 MG/2ML IJ SOLN
INTRAMUSCULAR | Status: DC | PRN
  Administered 2014-06-01: 1 mg via INTRAVENOUS
  Administered 2014-06-01: 2 mg via INTRAVENOUS

## 2014-06-01 MED ORDER — BUPIVACAINE HCL (PF) 0.25 % IJ SOLN
INTRAMUSCULAR | Status: AC
  Filled 2014-06-01: qty 30

## 2014-06-01 MED ORDER — SCOPOLAMINE 1 MG/3DAYS TD PT72
MEDICATED_PATCH | TRANSDERMAL | Status: AC
  Administered 2014-06-01: 1.5 mg via TRANSDERMAL
  Filled 2014-06-01: qty 1

## 2014-06-01 MED ORDER — FENTANYL CITRATE 0.05 MG/ML IJ SOLN: 25.0000 ug | INTRAMUSCULAR | Status: DC | PRN

## 2014-06-01 MED ORDER — SUCCINYLCHOLINE CHLORIDE 20 MG/ML IJ SOLN
INTRAMUSCULAR | Status: AC
  Filled 2014-06-01: qty 10

## 2014-06-01 MED ORDER — ROCURONIUM BROMIDE 100 MG/10ML IV SOLN
INTRAVENOUS | Status: DC | PRN
  Administered 2014-06-01: 5 mg via INTRAVENOUS
  Administered 2014-06-01: 10 mg via INTRAVENOUS

## 2014-06-01 MED ORDER — BUPIVACAINE HCL 0.25 % IJ SOLN
INTRAMUSCULAR | Status: DC | PRN
  Administered 2014-06-01: 5 mL

## 2014-06-01 MED ORDER — 0.9 % SODIUM CHLORIDE (POUR BTL) OPTIME
TOPICAL | Status: DC | PRN
  Administered 2014-06-01: 1000 mL

## 2014-06-01 MED ORDER — PROPOFOL 10 MG/ML IV BOLUS
INTRAVENOUS | Status: AC
  Filled 2014-06-01: qty 20

## 2014-06-01 MED ORDER — IBUPROFEN 600 MG PO TABS
ORAL_TABLET | ORAL | Status: AC
  Filled 2014-06-01: qty 1

## 2014-06-01 MED ORDER — MENTHOL 3 MG MT LOZG
1.0000 | LOZENGE | OROMUCOSAL | Status: DC | PRN
  Filled 2014-06-01: qty 9

## 2014-06-01 MED ORDER — NEOSTIGMINE METHYLSULFATE 10 MG/10ML IV SOLN
INTRAVENOUS | Status: DC | PRN
  Administered 2014-06-01: 1 mg via INTRAVENOUS

## 2014-06-01 MED ORDER — HYDROMORPHONE HCL 1 MG/ML IJ SOLN: 0.2000 mg | INTRAMUSCULAR | Status: DC | PRN

## 2014-06-01 MED ORDER — GLYCOPYRROLATE 0.2 MG/ML IJ SOLN
INTRAMUSCULAR | Status: AC
  Filled 2014-06-01: qty 1

## 2014-06-01 MED ORDER — IBUPROFEN 600 MG PO TABS
600.0000 mg | ORAL_TABLET | Freq: Once | ORAL | Status: AC
  Administered 2014-06-01: 600 mg via ORAL

## 2014-06-01 MED ORDER — SIMETHICONE 80 MG PO CHEW
80.0000 mg | CHEWABLE_TABLET | Freq: Four times a day (QID) | ORAL | Status: DC | PRN
Start: 2014-06-01 — End: 2014-06-01
  Filled 2014-06-01: qty 1

## 2014-06-01 MED ORDER — FENTANYL CITRATE 0.05 MG/ML IJ SOLN
INTRAMUSCULAR | Status: DC | PRN
  Administered 2014-06-01 (×2): 50 ug via INTRAVENOUS
  Administered 2014-06-01: 100 ug via INTRAVENOUS
  Administered 2014-06-01: 50 ug via INTRAVENOUS

## 2014-06-01 MED ORDER — ONDANSETRON HCL 4 MG/2ML IJ SOLN
INTRAMUSCULAR | Status: DC | PRN
  Administered 2014-06-01: 4 mg via INTRAVENOUS

## 2014-06-01 MED ORDER — DEXAMETHASONE SODIUM PHOSPHATE 4 MG/ML IJ SOLN
INTRAMUSCULAR | Status: AC
  Filled 2014-06-01: qty 1

## 2014-06-01 MED ORDER — SCOPOLAMINE 1 MG/3DAYS TD PT72
1.0000 | MEDICATED_PATCH | Freq: Once | TRANSDERMAL | Status: DC
  Administered 2014-06-01: 1.5 mg via TRANSDERMAL

## 2014-06-01 MED ORDER — ONDANSETRON HCL 4 MG/2ML IJ SOLN
INTRAMUSCULAR | Status: AC
  Filled 2014-06-01: qty 2

## 2014-06-01 MED ORDER — GLYCOPYRROLATE 0.2 MG/ML IJ SOLN
INTRAMUSCULAR | Status: DC | PRN
  Administered 2014-06-01: 0.2 mg via INTRAVENOUS

## 2014-06-01 MED ORDER — NEOSTIGMINE METHYLSULFATE 10 MG/10ML IV SOLN
INTRAVENOUS | Status: AC
  Filled 2014-06-01: qty 1

## 2014-06-01 MED ORDER — IBUPROFEN 600 MG PO TABS: 600.0000 mg | ORAL_TABLET | Freq: Four times a day (QID) | ORAL | Status: DC | PRN

## 2014-06-01 SURGICAL SUPPLY — 33 items
BENZOIN TINCTURE PRP APPL 2/3 (GAUZE/BANDAGES/DRESSINGS) ×2 IMPLANT
CABLE HIGH FREQUENCY MONO STRZ (ELECTRODE) IMPLANT
CATH ROBINSON RED A/P 16FR (CATHETERS) IMPLANT
CLOTH BEACON ORANGE TIMEOUT ST (SAFETY) ×2 IMPLANT
DRSG COVADERM PLUS 2X2 (GAUZE/BANDAGES/DRESSINGS) ×4 IMPLANT
DRSG OPSITE POSTOP 3X4 (GAUZE/BANDAGES/DRESSINGS) ×2 IMPLANT
DURAPREP 26ML APPLICATOR (WOUND CARE) ×2 IMPLANT
GLOVE BIO SURGEON STRL SZ 6.5 (GLOVE) ×2 IMPLANT
GLOVE BIOGEL PI IND STRL 7.0 (GLOVE) ×2 IMPLANT
GLOVE BIOGEL PI INDICATOR 7.0 (GLOVE) ×2
GOWN STRL REUS W/TWL LRG LVL3 (GOWN DISPOSABLE) ×4 IMPLANT
LIGASURE 5MM LAPAROSCOPIC (INSTRUMENTS) IMPLANT
LIQUID BAND (GAUZE/BANDAGES/DRESSINGS) ×2 IMPLANT
NEEDLE INSUFFLATION 120MM (ENDOMECHANICALS) IMPLANT
NS IRRIG 1000ML POUR BTL (IV SOLUTION) ×2 IMPLANT
PACK LAPAROSCOPY BASIN (CUSTOM PROCEDURE TRAY) ×2 IMPLANT
PAD POSITIONER PINK NONSTERILE (MISCELLANEOUS) ×2 IMPLANT
POUCH SPECIMEN RETRIEVAL 10MM (ENDOMECHANICALS) IMPLANT
PROTECTOR NERVE ULNAR (MISCELLANEOUS) ×4 IMPLANT
SET IRRIG TUBING LAPAROSCOPIC (IRRIGATION / IRRIGATOR) IMPLANT
SLEEVE XCEL OPT CAN 5 100 (ENDOMECHANICALS) ×2 IMPLANT
SOLUTION ELECTROLUBE (MISCELLANEOUS) IMPLANT
STRIP CLOSURE SKIN 1/4X4 (GAUZE/BANDAGES/DRESSINGS) ×2 IMPLANT
SUT MNCRL AB 4-0 PS2 18 (SUTURE) ×2 IMPLANT
SUT MON AB 4-0 PS1 27 (SUTURE) ×2 IMPLANT
SUT VICRYL 0 UR6 27IN ABS (SUTURE) ×2 IMPLANT
SYR 5ML LL (SYRINGE) IMPLANT
TOWEL OR 17X24 6PK STRL BLUE (TOWEL DISPOSABLE) ×4 IMPLANT
TRAY FOLEY CATH 14FR (SET/KITS/TRAYS/PACK) IMPLANT
TROCAR XCEL NON-BLD 11X100MML (ENDOMECHANICALS) IMPLANT
TROCAR XCEL NON-BLD 5MMX100MML (ENDOMECHANICALS) ×2 IMPLANT
WARMER LAPAROSCOPE (MISCELLANEOUS) ×2 IMPLANT
WATER STERILE IRR 1000ML POUR (IV SOLUTION) ×2 IMPLANT

## 2014-06-01 NOTE — Anesthesia Postprocedure Evaluation (Signed)
  Anesthesia Post-op Note  Patient: Alexis BearsJennifer J Herman  Procedure(s) Performed: Procedure(s): LAPAROSCOPY OPERATIVE (N/A) Patient is awake and responsive. Pain and nausea are reasonably well controlled. Vital signs are stable and clinically acceptable. Oxygen saturation is clinically acceptable. There are no apparent anesthetic complications at this time. Patient is ready for discharge.

## 2014-06-01 NOTE — Anesthesia Preprocedure Evaluation (Signed)

## 2014-06-01 NOTE — H&P (Signed)
Alexis Herman is an 44 y.o. female G1P1002 presents as a consultation for possible surgery from Dr. Aldona Bar. Patient notes that for the past several years her dysmenorrhea has been progressively worsening. She reports that during her menses her "stomach gets bloated, and her abdomen is so tender no one can touch it". She notes that the pelvic cramps are constant and are debilitating the first 2 days of her menstrual cycle. Rest of the days of her cycle she denies pain. Her menses is regular q28 days lasts 5 days. The first few days are heavier than it used to be but bleeding is not her chief complaint.    Pertinent Gynecological History: Menses: flow is moderate Bleeding: normal Contraception: tubal ligation DES exposure: denies Blood transfusions: none Sexually transmitted diseases: no past history Previous GYN Procedures: DNC c-section Last mammogram: normal Date: 2015 Last pap: normal Date: 2015 OB History: G1, P2   Menstrual History: Menarche age: 44  No LMP recorded.    Past Medical History  Diagnosis Date  . Irritable bowel syndrome (IBS)     diet controlled  . Tachycardia     occasional    Past Surgical History  Procedure Laterality Date  . Cesarean section  2004    x 1 twins  . Tonsillectomy    . Wisdom tooth extraction      Family History  Problem Relation Age of Onset  . Cancer Mother     breast  . COPD Father     Social History:  reports that she has never smoked. She has never used smokeless tobacco. She reports that she drinks alcohol. She reports that she does not use illicit drugs.  Allergies: No Known Allergies  Prescriptions prior to admission  Medication Sig Dispense Refill Last Dose  . Multiple Vitamin (MULTIVITAMIN WITH MINERALS) TABS tablet Take 1 tablet by mouth daily.     . traMADol (ULTRAM) 50 MG tablet Take 1 tablet (50 mg total) by mouth every 6 (six) hours as needed for pain. (Patient not taking: Reported on 05/17/2014) 15 tablet 0 Not  Taking at Unknown time    Review of Systems  Constitutional: Negative for fever and chills.  Eyes: Negative for blurred vision and double vision.  Respiratory: Negative for cough.   Cardiovascular: Negative for chest pain and palpitations.  Gastrointestinal: Positive for abdominal pain. Negative for heartburn and nausea.  Musculoskeletal: Negative for myalgias.  Skin: Negative for itching and rash.  Neurological: Negative for dizziness and headaches.  Psychiatric/Behavioral: Negative for depression.  All other systems reviewed and are negative.   Blood pressure 103/66, pulse 101, temperature 97.7 F (36.5 C), temperature source Oral, resp. rate 16, SpO2 100 %. Physical Exam  Vitals reviewed. Constitutional: She is oriented to person, place, and time. She appears well-developed and well-nourished.  HENT:  Head: Normocephalic and atraumatic.  Eyes: Pupils are equal, round, and reactive to light.  Neck: Normal range of motion.  Cardiovascular: Normal rate and regular rhythm.   Respiratory: Effort normal.  GI: Soft.  Genitourinary:  Vulva: no masses, atrophy, or lesions. Bladder/Urethra: no urethral discharge or mass and normal meatus and bladder non distended. Vagina no tenderness, erythema, cystocele, rectocele, abnormal vaginal discharge, or vesicle(s) or ulcers. Cervix: no discharge or cervical motion tenderness and grossly normal. Uterus: normal size and shape and midline, mobile, no uterine prolapse, and tender; midline tenderness. Adnexa/Parametria: no parametrial tenderness or mass, no ovarian mass, and adnexal tenderness; Left >>right; no palpable mass. Screening  Musculoskeletal: Normal range  of motion.  Neurological: She is alert and oriented to person, place, and time.    Results for orders placed or performed during the hospital encounter of 06/01/14 (from the past 24 hour(s))  Pregnancy, urine     Status: None   Collection Time: 06/01/14  9:45 AM  Result Value Ref  Range   Preg Test, Ur NEGATIVE NEGATIVE    No results found.  Assessment/Plan: 44 yo with pain , dysmenorrhea  Pelvic ultrasound today shows normal uterus,  Two small right ovarian hemorrhagic cysts.  Normal flow.  Discussed with etiology of her pain. First, endometriosis as a possibility however often times endometriosis is a disease that starts at a young age.  The patient does mention that her pain started after the birth (cesarean section) of her sons.  We discussed endometriotic implantation happens sometimes after a cesarean delivery.   We also talked about the possibility of adenomyosis.  She was informed that this diagnosis is only made after hysterectomy once the uterus is examined by Pathology.   We also discussed that her pelvic pain could be ovarian in etiology.   Another possibility is scar tissue from her cesarean.   We discussed several options for management of her pain.   One option is  just looking with a diagnostic laparoscopy, however patient was made aware that we would not necessarily be treating anything. Another option was performing a LAVH, with ovarian conservation.  We discussed that if her pain is ovarian in nature or endometriosis leaving her ovaries behind would not alleviate her pain. However, if the pain is from adenomyosis this would alleviate her pain.   Patient counseled that the plan is to perform a Diagnostic laparoscopy, however if endometrial implants or scar tissue is seen  it will possibly become an operative laparoscopy.  She agrees to a cystectomy if there is an ovarian cyst on either ovary with  the inherent risk of possible oopherectomy.  Risks of surgery  were explained to include, but are not limited to that of infection or blood loss, injury to bowel, bladder, ureters, nerves arteries or veins.  Patient also counseled that if there is evidence of an injury there may be a need to convert to an open laparotomy procedure.    She demonstrates  understanding of these risks.  All her questions were answered and she desires to proceed with the procedure.   Essie HartINN, Ellana Kawa STACIA 06/01/2014, 10:57 AM

## 2014-06-01 NOTE — Op Note (Signed)
Procedure(s): LAPAROSCOPY OPERATIVE Procedure Note  Alexis Herman female 44 y.o. 06/01/2014  Procedure(s) and Anesthesia Type:    * LAPAROSCOPY OPERATIVE - General  Surgeon(s) and Role:    * Essie Hart, MD - Primary   Indications: The patient was taken to OR with h/o pelvic pain / dysmenorrhea  Surgeon: Wynonia Hazard   Assistants: None  Anesthesia: General endotracheal anesthesia  ASA Class: 1    Procedure Detail  LAPAROSCOPY OPERATIVE  Findings: Exam under anesthesia: Normal external vulva, normal vaginal vault. Normal appearing cervix Laparoscopic findings:  Normal appearing uterus.  Left ovary adherent to left pelvic side wall. Multiple sites seen with endometriotic implants including both ovaries and fallopian tubes.  Extensive endometriosis seen in Pouch of [place] and along uterosacral ligaments.   Right fallopian tube with small paratubal cyst.   Appendix normal in appearance but fecolith palpable.  Normal liver edge.   Estimated Blood Loss:  5 mL         Drains: Foley Catheter during case discontinued in OR         Total IV Fluids: 2000 ml  Blood Given: none          Specimens: Peritoneal biopsy / Uterosacral biopsy.          Implants: none        Complications:  * No complications entered in OR log *         Disposition: PACU - hemodynamically stable.         Condition: stable     Procedure: The patient was taken to the operating room after the risks, benefits, alternatives, complications, treatment options, and expected outcomes were discussed with the patient. The patient verbalized understanding, the patient concurred with the proposed plan and consent signed and witnessed. The patient was taken to the Operating Room #3, identified as Alexis Herman and the procedure verified as laparoscopic ovarian cystectomy. A Time Out was held and the above information confirmed.  The patient was taken to the operating room after appropriate  identification and placed on the operating table. After the attainment of adequate general anesthesia she was placed in the modified lithotomy position using Allen stirrups.  An examination under anesthesia was performed.  The abdomen was prepped with ChloraPrep. The perineum and vagina were prepped with multiple layers of Betadine.  The bladder was catheterized with a foley. The abdomen and perineum were draped as a sterile field. .  A Hulka tenaculum was placed into the endometrial canal and fixed to the anterior lip of the cervix.  The surgeon re- gloved.    A 5 mm midline infra-umbilical incision was made after infiltration with 0.25% Marcaine. Using direct entry 5mm Excel Optiview port attached to the 0-degree 5mm operative laparoscope was entered into the tented up abdomen under direct video visualization.  Confirmation of placement was made with the laparoscope and pneumoperitoneum was made with approximately 2L C02 gas.  The patient was placed in steep Trendelenburg. Marcaine injected in the LLQ and a 5 mm incision was made and 5 mm trocar advanced into the intraabdominal cavity along the LLQ.   This was performed under direct video visualization. There was no noted injury with placement of any trochars. A complete abdominal and pelvic survey was performed with findings noted above.  Peritoneal biopsy was performed as well as a biopsy of an endometriotic implant along the right uterosacral ligament.   Fulguration could not be performed safely d/t the location of the endometriosis.  All trochars were then removed from the peritoneal cavity under direct visualization as the CO2 was allowed to escape. The umbilical incision was closed with 4-0 monocryl and  All skin incisions were closed with Dermabond.   The uterine manipulator was removed and the cervix made hemostatic with silver nitrate sticks. The Foley catheter was then removed. The patient was awakened from general anesthesia and taken to the  recovery room in satisfactory condition having tolerated the procedure well with sponge and instrument counts correct. It was anticipated that she would be discharged home later that afternoon.  Timmy Cleverly Alexis Herman

## 2014-06-01 NOTE — Transfer of Care (Signed)
Immediate Anesthesia Transfer of Care Note  Patient: Alexis Herman  Procedure(s) Performed: Procedure(s): LAPAROSCOPY OPERATIVE (N/A)  Patient Location: PACU  Anesthesia Type:General  Level of Consciousness: awake  Airway & Oxygen Therapy: Patient Spontanous Breathing  Post-op Assessment: Report given to PACU RN  Post vital signs: stable  Filed Vitals:   06/01/14 1006  BP: 103/66  Pulse: 101  Temp: 36.5 C  Resp: 16    Complications: No apparent anesthesia complications

## 2014-06-02 ENCOUNTER — Encounter (HOSPITAL_COMMUNITY): Payer: Self-pay | Admitting: Obstetrics & Gynecology

## 2014-06-09 ENCOUNTER — Other Ambulatory Visit: Payer: Self-pay

## 2014-06-09 DIAGNOSIS — Z1231 Encounter for screening mammogram for malignant neoplasm of breast: Secondary | ICD-10-CM

## 2014-07-01 ENCOUNTER — Other Ambulatory Visit: Payer: Self-pay | Admitting: Obstetrics & Gynecology

## 2014-07-12 ENCOUNTER — Ambulatory Visit
Admission: RE | Admit: 2014-07-12 | Discharge: 2014-07-12 | Disposition: A | Discharge status: Home / Self Care | Payer: BLUE CROSS/BLUE SHIELD | Source: Ambulatory Visit

## 2014-07-12 DIAGNOSIS — Z1231 Encounter for screening mammogram for malignant neoplasm of breast: Secondary | ICD-10-CM

## 2015-02-18 ENCOUNTER — Ambulatory Visit (INDEPENDENT_AMBULATORY_CARE_PROVIDER_SITE_OTHER): Payer: BLUE CROSS/BLUE SHIELD | Admitting: Cardiology

## 2015-02-18 ENCOUNTER — Encounter: Payer: Self-pay | Admitting: Cardiology

## 2015-02-18 VITALS — BP 110/70 | HR 75 | Ht 64.0 in | Wt 150.4 lb

## 2015-02-18 DIAGNOSIS — I471 Supraventricular tachycardia: Secondary | ICD-10-CM | POA: Insufficient documentation

## 2015-02-18 DIAGNOSIS — R0789 Other chest pain: Secondary | ICD-10-CM | POA: Diagnosis not present

## 2015-02-18 NOTE — Patient Instructions (Signed)
Medication Instructions:  Your physician recommends that you continue on your current medications as directed. Please refer to the Current Medication list given to you today.    Labwork: NONE ORDER TODAY    Testing/Procedures:   Your physician has requested that you have an exercise tolerance test. For further information please visit [email or web address]. Please also follow instruction sheet, as given.  Follow-Up:  AS NEEDED FOR ANY CARDIAC RELATES ISSUES  Any Other Special Instructions Will Be Listed Below (If Applicable).

## 2015-02-18 NOTE — Progress Notes (Signed)
Cardiology Office Note   Date:  02/18/2015   ID:  Alexis Herman, DOB 10/09/1970, MRN [id number]  PCP:  No PCP Per Patient  Cardiologist:   Donato Schultz, MD       History of Present Illness: Alexis Herman is a 44 y.o. female here for evaluation of atypical chest pain. Previously she was seen with supraventricular tachycardia last seen by me on 07/01/2009. Previously she was thought to have possible AV nodal reentrant tachycardia with prior EKG showing heart rate of 150 bpm with possible retrograde P wave. Previously was in the emergency room given Lopressor fluids Cardizem. Very rarely has episodes. TSH in the past has been normal. Echo in the past has been unremarkable. Prior SVT responded to vagal maneuvers. Has had relative hypotension in the past with some dizziness with medications. Previously we spoke about ablation therapy.  Tachycardia is about the same as prior (2x a month-5 sec to 1 hour), but recently has some chest heaviness, pressure, left arm feels tingling. Can happen sitting or active. Fades away. . Usually shorter. Does not seem to be associated with foods. No syncope, dizziness, fevers, orthopnea, PND.  Continues to work at [employer].    Past Medical History  Diagnosis Date  . Irritable bowel syndrome (IBS)     diet controlled  . Tachycardia     occasional    Past Surgical History  Procedure Laterality Date  . Cesarean section  2004    x 1 twins  . Tonsillectomy    . Wisdom tooth extraction    . Laparoscopy N/A 06/01/2014    Procedure: LAPAROSCOPY OPERATIVE;  Surgeon: Essie Hart, MD;  Location: [facility];  Service: Gynecology;  Laterality: N/A;     Current Outpatient Prescriptions  Medication Sig Dispense Refill  . ibuprofen (ADVIL,MOTRIN) 600 MG tablet Take 1 tablet (600 mg total) by mouth every 6 (six) hours as needed for mild pain. 60 tablet 3  . Multiple Vitamin (MULTIVITAMIN WITH MINERALS) TABS tablet Take 1 tablet by mouth daily.      No current facility-administered medications for this visit.    Allergies:   Review of patient's allergies indicates no known allergies.    Social History:  The patient  reports that she has never smoked. She has never used smokeless tobacco. She reports that she drinks alcohol. She reports that she does not use illicit drugs.   Family History:  The patient's family history includes COPD in her father; Cancer in her mother.  Mother has AFIB. Father no CAD.  ROS:  Please see the history of present illness.   Otherwise, review of systems are positive for none.   All other systems are reviewed and negative.    PHYSICAL EXAM: VS:  BP 110/70 mmHg  Pulse 75  Ht  (1.626 m)  Wt 150 lb 6.4 oz (68.221 kg)  BMI 25.80 kg/m2  LMP 02/04/2015 , BMI Body mass index is 25.8 kg/(m^2). GEN: Well nourished, well developed, in no acute distress HEENT: normal Neck: no JVD, carotid bruits, or masses Cardiac: RRR; no murmurs, rubs, or gallops,no edema  Respiratory:  clear to auscultation bilaterally, normal work of breathing GI: soft, nontender, nondistended, + BS MS: no deformity or atrophy Skin: warm and dry, no rash Neuro:  Strength and sensation are intact Psych: euthymic mood, full affect   EKG:  Today 02/18/15-sinus rhythm, 78, normal intervals.  Recent Labs: 05/27/2014: Hemoglobin 13.5; Platelets 223    Lipid Panel No results found  for: CHOL, TRIG, HDL, CHOLHDL, VLDL, LDLCALC, LDLDIRECT    Wt Readings from Last 3 Encounters:  02/18/15 150 lb 6.4 oz (68.221 kg)  05/27/14 142 lb (64.411 kg)  10/26/12 135 lb (61.236 kg)      Other studies Reviewed: Additional studies/ records that were reviewed today include: Prior office note, echo which was normal, lab work TSH was normal reviewed. EKG personally reviewed. Review of the above records demonstrates: as above   ASSESSMENT AND PLAN:  1.  Atypical chest pain-can occur with exertion, at rest, while sitting, at night,  left-sided sometimes a heaviness and occasional left arm heaviness as well sometimes tingling in her hands. No associated shortness of breath, syncope. No early family history of CAD. Given the symptoms, we will proceed with exercise treadmill test.  2. Supraventricular tachycardia-has had these episodes for quite some time. She is able to terminate them usually with Valsalva maneuver, happen about twice a month, sometimes 5 minutes duration but have been longer. Offered her once again EP if symptoms worsen. She will let us know.    Current medicines are reviewed at length with the patient today.  The patient does not have concerns regarding medicines.  The following changes have been made:  no change  Labs/ tests ordered today include:   Orders Placed This Encounter  Procedures  . Exercise Tolerance Test  . EKG 12-Lead     Disposition:    We will let her know results of testing.  Mathews RobinsonsSigned, Tibor Lemmons, MD  02/18/2015 9:24 AM    [facility] [address], [place], [place]  [address] Phone: [phone]; Fax: [phone]

## 2015-03-17 ENCOUNTER — Encounter: Payer: BLUE CROSS/BLUE SHIELD | Admitting: Physician Assistant

## 2015-03-29 ENCOUNTER — Encounter: Payer: BLUE CROSS/BLUE SHIELD | Admitting: Nurse Practitioner

## 2015-03-29 ENCOUNTER — Other Ambulatory Visit (HOSPITAL_COMMUNITY): Payer: BLUE CROSS/BLUE SHIELD

## 2015-03-29 ENCOUNTER — Other Ambulatory Visit: Payer: Self-pay | Admitting: Obstetrics

## 2015-03-31 NOTE — Patient Instructions (Addendum)
Your procedure is scheduled on:  Monday, Dec. 5, 2016  [facility] at: 10:45 A.M.  Pick up the phone at the desk and dial [phone].  Call this number if you have problems the morning of surgery: [phone].  Remember: Do NOT eat food:  After Midnight Sunday Do NOT drink clear liquids after:  8:00 A.M. Day of surgery Take these medicines the morning of surgery with a SIP OF WATER:  None  Do NOT wear jewelry (body piercing), metal hair clips/bobby pins, make-up, or nail polish. Do NOT wear lotions, powders, or perfumes.  You may wear deoderant. Do NOT shave for 48 hours prior to surgery. Do NOT bring valuables to the hospital. Contacts, dentures, or bridgework may not be worn into surgery. Leave suitcase in car.  After surgery it may be brought to your room.  For patients admitted to the hospital, checkout time is 11:00 AM the day of discharge.

## 2015-04-01 ENCOUNTER — Encounter (HOSPITAL_COMMUNITY)
Admission: RE | Admit: 2015-04-01 | Discharge: 2015-04-01 | Disposition: A | Discharge status: Home / Self Care | Payer: BLUE CROSS/BLUE SHIELD | Source: Ambulatory Visit | Attending: Obstetrics | Admitting: Obstetrics

## 2015-04-01 ENCOUNTER — Encounter (HOSPITAL_COMMUNITY): Payer: Self-pay

## 2015-04-01 DIAGNOSIS — N946 Dysmenorrhea, unspecified: Secondary | ICD-10-CM | POA: Diagnosis present

## 2015-04-01 DIAGNOSIS — N801 Endometriosis of ovary: Secondary | ICD-10-CM | POA: Diagnosis not present

## 2015-04-01 DIAGNOSIS — N838 Other noninflammatory disorders of ovary, fallopian tube and broad ligament: Secondary | ICD-10-CM | POA: Diagnosis not present

## 2015-04-01 DIAGNOSIS — N803 Endometriosis of pelvic peritoneum: Secondary | ICD-10-CM | POA: Diagnosis not present

## 2015-04-01 DIAGNOSIS — R102 Pelvic and perineal pain: Secondary | ICD-10-CM | POA: Diagnosis not present

## 2015-04-01 DIAGNOSIS — K589 Irritable bowel syndrome without diarrhea: Secondary | ICD-10-CM | POA: Diagnosis not present

## 2015-04-01 LAB — CBC
HCT: 40.1 % (ref 36.0–46.0)
Hemoglobin: 13.4 g/dL (ref 12.0–15.0)
MCH: 29.9 pg (ref 26.0–34.0)
MCHC: 33.4 g/dL (ref 30.0–36.0)
MCV: 89.5 fL (ref 78.0–100.0)
PLATELETS: 245 10*3/uL (ref 150–400)
RBC: 4.48 MIL/uL (ref 3.87–5.11)
RDW: 12.4 % (ref 11.5–15.5)
WBC: 5.6 10*3/uL (ref 4.0–10.5)

## 2015-04-03 NOTE — H&P (Signed)
44 y.o. female with a long history of progressively worsening dysmenorrhea presents for surgical management.  She had diagnostic laparoscopic in spring 2016 by Dr. Mora ApplPinn that showed biopsy proven endometriosis.  Pain is worst the first few days of menses.  The pain is midline and left-sided.  She has failed multiple trials of OCPs due to persistent bleeding/spotting.  Patient desires surgical management at this time.   Past Medical History  Diagnosis Date  . Irritable bowel syndrome (IBS)     diet controlled  . Tachycardia     occasional    Past Surgical History  Procedure Laterality Date  . Cesarean section  2004    x 1 twins  . Tonsillectomy    . Wisdom tooth extraction    . Laparoscopy N/A 06/01/2014    Procedure: LAPAROSCOPY OPERATIVE;  Surgeon: Essie HartWalda Pinn, MD;  Location: [facility];  Service: Gynecology;  Laterality: N/A;    OB History  No data available    Social History   Social History  . Marital Status: Married    Spouse Name: N/A  . Number of Children: N/A  . Years of Education: N/A   Occupational History  . Not on file.   Social History Main Topics  . Smoking status: Never Smoker   . Smokeless tobacco: Never Used  . Alcohol Use: Yes     Comment: occasional  . Drug Use: No  . Sexual Activity: Yes    Birth Control/ Protection: None   Other Topics Concern  . Not on file   Social History Narrative   Review of patient's allergies indicates no known allergies.    Filed Vitals:   04/04/15 1103  BP: 107/71  Pulse: 82  Temp: 98 F (36.7 C)  Resp: 20     General:  NAD Abdomen:  Soft Ex:  No edema    A/P   44 y.o. with endometriosis presents for LAVH, bilateral salpingectomy, LSO, possible RSO, possible cystoscopy, possible laparotomy.  At this time, patient desires ovarian conservation of right ovary if possible with hopes of avoiding surgical menopause. Discussed in detail risk, benefits, alternatives to procedures.  She understands the risks to include  infection, bleeding, damage to surrounding structures (including but not limited to bowel, bladder, ureter, uovaries, nerves, vessels), risk of conversion to laparotomy, need for additional procedures, failure of the procedure to fully alleviate her current pain symptoms, possibility of recurrence.  We discussed again in detail today that with endometriosis, if we leave the right ovary, she may continue to have pelvic pain and may ultimately require additional surgical management.  She understands and elects to proceed.  Consent signed.  UPT negative in holding.    [facility] For ChildrenDYANNA Herman [employer]CLARK

## 2015-04-04 ENCOUNTER — Encounter (HOSPITAL_COMMUNITY): Payer: Self-pay

## 2015-04-04 ENCOUNTER — Observation Stay (HOSPITAL_COMMUNITY)
Admission: RE | Admit: 2015-04-04 | Discharge: 2015-04-05 | Disposition: A | Discharge status: Home / Self Care | Payer: BLUE CROSS/BLUE SHIELD | Source: Ambulatory Visit | Attending: Obstetrics | Admitting: Obstetrics

## 2015-04-04 ENCOUNTER — Encounter (HOSPITAL_COMMUNITY)
Admission: RE | Disposition: A | Discharge status: Home / Self Care | Payer: Self-pay | Source: Ambulatory Visit | Attending: Obstetrics

## 2015-04-04 ENCOUNTER — Ambulatory Visit (HOSPITAL_COMMUNITY): Payer: BLUE CROSS/BLUE SHIELD | Admitting: Certified Registered Nurse Anesthetist

## 2015-04-04 DIAGNOSIS — N809 Endometriosis, unspecified: Secondary | ICD-10-CM | POA: Diagnosis present

## 2015-04-04 DIAGNOSIS — K589 Irritable bowel syndrome without diarrhea: Secondary | ICD-10-CM | POA: Insufficient documentation

## 2015-04-04 DIAGNOSIS — R102 Pelvic and perineal pain: Secondary | ICD-10-CM | POA: Insufficient documentation

## 2015-04-04 DIAGNOSIS — N946 Dysmenorrhea, unspecified: Secondary | ICD-10-CM | POA: Diagnosis not present

## 2015-04-04 DIAGNOSIS — N801 Endometriosis of ovary: Secondary | ICD-10-CM | POA: Insufficient documentation

## 2015-04-04 DIAGNOSIS — N838 Other noninflammatory disorders of ovary, fallopian tube and broad ligament: Secondary | ICD-10-CM | POA: Insufficient documentation

## 2015-04-04 DIAGNOSIS — N803 Endometriosis of pelvic peritoneum: Secondary | ICD-10-CM | POA: Insufficient documentation

## 2015-04-04 HISTORY — PX: TOTAL VAGINAL HYSTERECTOMY: SHX2548

## 2015-04-04 HISTORY — PX: OOPHORECTOMY: SHX6387

## 2015-04-04 HISTORY — PX: BILATERAL SALPINGECTOMY: SHX5743

## 2015-04-04 HISTORY — PX: LAPAROSCOPIC ASSISTED VAGINAL HYSTERECTOMY: SHX5398

## 2015-04-04 LAB — BASIC METABOLIC PANEL
Anion gap: 7 (ref 5–15)
BUN: 10 mg/dL (ref 6–20)
CALCIUM: 9.2 mg/dL (ref 8.9–10.3)
CHLORIDE: 104 mmol/L (ref 101–111)
CO2: 27 mmol/L (ref 22–32)
CREATININE: 0.64 mg/dL (ref 0.44–1.00)
GFR calc non Af Amer: >60 mL/min (ref >60)
Glucose, Bld: 100 mg/dL — ABNORMAL HIGH (ref 65–99)
POTASSIUM: 3.8 mmol/L (ref 3.5–5.1)
SODIUM: 138 mmol/L (ref 135–145)

## 2015-04-04 LAB — TYPE AND SCREEN
ABO/RH(D): O POS
ANTIBODY SCREEN: NEGATIVE

## 2015-04-04 LAB — PREGNANCY, URINE: Preg Test, Ur: NEGATIVE

## 2015-04-04 LAB — ABO/RH: ABO/RH(D): O POS

## 2015-04-04 SURGERY — HYSTERECTOMY, VAGINAL, LAPAROSCOPY-ASSISTED
Anesthesia: General | Site: Abdomen

## 2015-04-04 MED ORDER — ONDANSETRON HCL 4 MG/2ML IJ SOLN
INTRAMUSCULAR | Status: DC | PRN
  Administered 2015-04-04 (×2): 2 mg via INTRAVENOUS

## 2015-04-04 MED ORDER — ONDANSETRON HCL 4 MG/2ML IJ SOLN: 4.0000 mg | Freq: Four times a day (QID) | INTRAMUSCULAR | Status: DC | PRN

## 2015-04-04 MED ORDER — LIDOCAINE HCL (CARDIAC) 20 MG/ML IV SOLN
INTRAVENOUS | Status: DC | PRN
  Administered 2015-04-04: 80 mg via INTRAVENOUS

## 2015-04-04 MED ORDER — SCOPOLAMINE 1 MG/3DAYS TD PT72
1.0000 | MEDICATED_PATCH | Freq: Once | TRANSDERMAL | Status: AC
  Administered 2015-04-04: 1.5 mg via TRANSDERMAL
  Administered 2015-04-04: 1 via TRANSDERMAL

## 2015-04-04 MED ORDER — MEPERIDINE HCL 25 MG/ML IJ SOLN: 6.2500 mg | INTRAMUSCULAR | Status: DC | PRN

## 2015-04-04 MED ORDER — SCOPOLAMINE 1 MG/3DAYS TD PT72
MEDICATED_PATCH | TRANSDERMAL | Status: AC
  Administered 2015-04-04: 1.5 mg via TRANSDERMAL
  Filled 2015-04-04: qty 1

## 2015-04-04 MED ORDER — HYDROCODONE-ACETAMINOPHEN 7.5-325 MG PO TABS: 1.0000 | ORAL_TABLET | Freq: Once | ORAL | Status: DC | PRN

## 2015-04-04 MED ORDER — LIDOCAINE HCL (CARDIAC) 20 MG/ML IV SOLN
INTRAVENOUS | Status: AC
  Filled 2015-04-04: qty 5

## 2015-04-04 MED ORDER — SODIUM CHLORIDE 0.9 % IJ SOLN
INTRAMUSCULAR | Status: AC
  Filled 2015-04-04: qty 50

## 2015-04-04 MED ORDER — LACTATED RINGERS IV SOLN
INTRAVENOUS | Status: DC
  Administered 2015-04-04 (×4): via INTRAVENOUS

## 2015-04-04 MED ORDER — OXYCODONE-ACETAMINOPHEN 5-325 MG PO TABS
1.0000 | ORAL_TABLET | Freq: Four times a day (QID) | ORAL | Status: DC | PRN
  Administered 2015-04-04 – 2015-04-05 (×3): 1 via ORAL
  Filled 2015-04-04 (×3): qty 1

## 2015-04-04 MED ORDER — DEXAMETHASONE SODIUM PHOSPHATE 10 MG/ML IJ SOLN
INTRAMUSCULAR | Status: AC
  Filled 2015-04-04: qty 1

## 2015-04-04 MED ORDER — BUPIVACAINE HCL (PF) 0.25 % IJ SOLN
INTRAMUSCULAR | Status: DC | PRN
  Administered 2015-04-04: 8 mL

## 2015-04-04 MED ORDER — NEOSTIGMINE METHYLSULFATE 10 MG/10ML IV SOLN
INTRAVENOUS | Status: DC | PRN
  Administered 2015-04-04: 2.5 mg via INTRAVENOUS

## 2015-04-04 MED ORDER — ONDANSETRON HCL 4 MG PO TABS: 4.0000 mg | ORAL_TABLET | Freq: Four times a day (QID) | ORAL | Status: DC | PRN

## 2015-04-04 MED ORDER — MIDAZOLAM HCL 2 MG/2ML IJ SOLN
INTRAMUSCULAR | Status: AC
  Filled 2015-04-04: qty 2

## 2015-04-04 MED ORDER — BUPIVACAINE HCL (PF) 0.25 % IJ SOLN
INTRAMUSCULAR | Status: AC
  Filled 2015-04-04: qty 30

## 2015-04-04 MED ORDER — PROPOFOL 10 MG/ML IV BOLUS
INTRAVENOUS | Status: AC
  Filled 2015-04-04: qty 20

## 2015-04-04 MED ORDER — LACTATED RINGERS IV SOLN
INTRAVENOUS | Status: DC
  Administered 2015-04-04 – 2015-04-05 (×3): via INTRAVENOUS

## 2015-04-04 MED ORDER — LACTATED RINGERS IR SOLN
Status: DC | PRN
  Administered 2015-04-04: 1000 mL

## 2015-04-04 MED ORDER — NEOSTIGMINE METHYLSULFATE 10 MG/10ML IV SOLN
INTRAVENOUS | Status: AC
  Filled 2015-04-04: qty 1

## 2015-04-04 MED ORDER — FENTANYL CITRATE (PF) 250 MCG/5ML IJ SOLN
INTRAMUSCULAR | Status: AC
  Filled 2015-04-04: qty 5

## 2015-04-04 MED ORDER — SIMETHICONE 80 MG PO CHEW: 80.0000 mg | CHEWABLE_TABLET | Freq: Four times a day (QID) | ORAL | Status: DC | PRN

## 2015-04-04 MED ORDER — IBUPROFEN 600 MG PO TABS
600.0000 mg | ORAL_TABLET | Freq: Four times a day (QID) | ORAL | Status: DC | PRN
  Administered 2015-04-04 – 2015-04-05 (×2): 600 mg via ORAL
  Filled 2015-04-04 (×2): qty 1

## 2015-04-04 MED ORDER — DOCUSATE SODIUM 100 MG PO CAPS
100.0000 mg | ORAL_CAPSULE | Freq: Two times a day (BID) | ORAL | Status: DC
  Administered 2015-04-04 – 2015-04-05 (×2): 100 mg via ORAL
  Filled 2015-04-04 (×2): qty 1

## 2015-04-04 MED ORDER — MENTHOL 3 MG MT LOZG: 1.0000 | LOZENGE | OROMUCOSAL | Status: DC | PRN

## 2015-04-04 MED ORDER — KETOROLAC TROMETHAMINE 30 MG/ML IJ SOLN
INTRAMUSCULAR | Status: AC
  Filled 2015-04-04: qty 1

## 2015-04-04 MED ORDER — MIDAZOLAM HCL 2 MG/2ML IJ SOLN
INTRAMUSCULAR | Status: DC | PRN
  Administered 2015-04-04: 0.5 mg via INTRAVENOUS
  Administered 2015-04-04: 1.5 mg via INTRAVENOUS

## 2015-04-04 MED ORDER — GLYCOPYRROLATE 0.2 MG/ML IJ SOLN
INTRAMUSCULAR | Status: AC
  Filled 2015-04-04: qty 3

## 2015-04-04 MED ORDER — ONDANSETRON HCL 4 MG/2ML IJ SOLN
INTRAMUSCULAR | Status: AC
  Filled 2015-04-04: qty 2

## 2015-04-04 MED ORDER — CEFAZOLIN SODIUM-DEXTROSE 2-3 GM-% IV SOLR
2.0000 g | INTRAVENOUS | Status: AC
  Administered 2015-04-04: 2 g via INTRAVENOUS

## 2015-04-04 MED ORDER — GLYCOPYRROLATE 0.2 MG/ML IJ SOLN
INTRAMUSCULAR | Status: DC | PRN
  Administered 2015-04-04 (×2): 0.1 mg via INTRAVENOUS
  Administered 2015-04-04: .5 mg via INTRAVENOUS

## 2015-04-04 MED ORDER — METOCLOPRAMIDE HCL 5 MG/ML IJ SOLN: 10.0000 mg | Freq: Once | INTRAMUSCULAR | Status: DC | PRN

## 2015-04-04 MED ORDER — KETOROLAC TROMETHAMINE 30 MG/ML IJ SOLN
INTRAMUSCULAR | Status: DC | PRN
  Administered 2015-04-04: 30 mg via INTRAVENOUS

## 2015-04-04 MED ORDER — HYDROMORPHONE HCL 1 MG/ML IJ SOLN
INTRAMUSCULAR | Status: AC
  Filled 2015-04-04: qty 1

## 2015-04-04 MED ORDER — HYDROMORPHONE HCL 1 MG/ML IJ SOLN
INTRAMUSCULAR | Status: DC | PRN
  Administered 2015-04-04: 0.5 mg via INTRAVENOUS
  Administered 2015-04-04 (×2): 1 mg via INTRAVENOUS
  Administered 2015-04-04: 0.5 mg via INTRAVENOUS

## 2015-04-04 MED ORDER — VASOPRESSIN 20 UNIT/ML IV SOLN
INTRAVENOUS | Status: AC
  Filled 2015-04-04: qty 1

## 2015-04-04 MED ORDER — SODIUM CHLORIDE 0.9 % IJ SOLN
INTRAMUSCULAR | Status: AC
  Filled 2015-04-04: qty 30

## 2015-04-04 MED ORDER — VASOPRESSIN 20 UNIT/ML IV SOLN
INTRAVENOUS | Status: DC | PRN
  Administered 2015-04-04: 14 mL via INTRAMUSCULAR

## 2015-04-04 MED ORDER — FENTANYL CITRATE (PF) 100 MCG/2ML IJ SOLN
INTRAMUSCULAR | Status: DC | PRN
  Administered 2015-04-04 (×2): 50 ug via INTRAVENOUS
  Administered 2015-04-04: 100 ug via INTRAVENOUS
  Administered 2015-04-04: 50 ug via INTRAVENOUS

## 2015-04-04 MED ORDER — ROCURONIUM BROMIDE 100 MG/10ML IV SOLN
INTRAVENOUS | Status: AC
  Filled 2015-04-04: qty 1

## 2015-04-04 MED ORDER — DEXAMETHASONE SODIUM PHOSPHATE 10 MG/ML IJ SOLN
INTRAMUSCULAR | Status: DC | PRN
  Administered 2015-04-04: 8 mg via INTRAVENOUS

## 2015-04-04 MED ORDER — METOCLOPRAMIDE HCL 5 MG/ML IJ SOLN
INTRAMUSCULAR | Status: AC
  Filled 2015-04-04: qty 2

## 2015-04-04 MED ORDER — ROCURONIUM BROMIDE 100 MG/10ML IV SOLN
INTRAVENOUS | Status: DC | PRN
  Administered 2015-04-04: 10 mg via INTRAVENOUS
  Administered 2015-04-04: 40 mg via INTRAVENOUS
  Administered 2015-04-04: 5 mg via INTRAVENOUS

## 2015-04-04 MED ORDER — METOCLOPRAMIDE HCL 5 MG/ML IJ SOLN
INTRAMUSCULAR | Status: DC | PRN
  Administered 2015-04-04: 10 mg via INTRAVENOUS

## 2015-04-04 MED ORDER — PROPOFOL 10 MG/ML IV BOLUS
INTRAVENOUS | Status: DC | PRN
  Administered 2015-04-04: 170 mg via INTRAVENOUS

## 2015-04-04 MED ORDER — FENTANYL CITRATE (PF) 100 MCG/2ML IJ SOLN: 25.0000 ug | INTRAMUSCULAR | Status: DC | PRN

## 2015-04-04 MED ORDER — CEFAZOLIN SODIUM-DEXTROSE 2-3 GM-% IV SOLR
INTRAVENOUS | Status: AC
  Filled 2015-04-04: qty 50

## 2015-04-04 SURGICAL SUPPLY — 42 items
CLOTH BEACON ORANGE TIMEOUT ST (SAFETY) ×5 IMPLANT
COVER BACK TABLE 60X90IN (DRAPES) ×5 IMPLANT
COVER LIGHT HANDLE  1/PK (MISCELLANEOUS) ×1
COVER LIGHT HANDLE 1/PK (MISCELLANEOUS) ×4 IMPLANT
DECANTER SPIKE VIAL GLASS SM (MISCELLANEOUS) ×15 IMPLANT
DRSG COVADERM PLUS 2X2 (GAUZE/BANDAGES/DRESSINGS) ×10 IMPLANT
DRSG OPSITE POSTOP 3X4 (GAUZE/BANDAGES/DRESSINGS) ×5 IMPLANT
DURAPREP 26ML APPLICATOR (WOUND CARE) ×5 IMPLANT
ELECT REM PT RETURN 9FT ADLT (ELECTROSURGICAL) ×5
ELECTRODE REM PT RTRN 9FT ADLT (ELECTROSURGICAL) ×4 IMPLANT
EVACUATOR SMOKE 8.L (FILTER) IMPLANT
GLOVE BIO SURGEON STRL SZ 6.5 (GLOVE) ×10 IMPLANT
GLOVE BIO SURGEON STRL SZ7 (GLOVE) ×10 IMPLANT
GLOVE BIOGEL PI IND STRL 7.0 (GLOVE) ×8 IMPLANT
GLOVE BIOGEL PI INDICATOR 7.0 (GLOVE) ×2
LEGGING LITHOTOMY PAIR STRL (DRAPES) ×5 IMPLANT
LIGASURE 5MM LAPAROSCOPIC (INSTRUMENTS) ×5 IMPLANT
LIGASURE IMPACT 36 18CM CVD LR (INSTRUMENTS) IMPLANT
LIQUID BAND (GAUZE/BANDAGES/DRESSINGS) ×5 IMPLANT
NS IRRIG 1000ML POUR BTL (IV SOLUTION) ×5 IMPLANT
PACK LAVH (CUSTOM PROCEDURE TRAY) ×5 IMPLANT
PACK ROBOTIC GOWN (GOWN DISPOSABLE) ×5 IMPLANT
PAD POSITIONING PINK XL (MISCELLANEOUS) ×5 IMPLANT
SCISSORS LAP 5X35 DISP (ENDOMECHANICALS) IMPLANT
SET CYSTO W/LG BORE CLAMP LF (SET/KITS/TRAYS/PACK) IMPLANT
SET IRRIG TUBING LAPAROSCOPIC (IRRIGATION / IRRIGATOR) ×5 IMPLANT
SLEEVE XCEL OPT CAN 5 100 (ENDOMECHANICALS) ×5 IMPLANT
SPONGE LAP 18X18 X RAY DECT (DISPOSABLE) IMPLANT
SUT MON AB 4-0 PS1 27 (SUTURE) ×5 IMPLANT
SUT VIC AB 0 CT1 27 (SUTURE) ×4
SUT VIC AB 0 CT1 27XBRD ANBCTR (SUTURE) ×8 IMPLANT
SUT VIC AB 0 CT1 27XCR 8 STRN (SUTURE) ×8 IMPLANT
SUT VIC AB 2-0 CT1 (SUTURE) ×10 IMPLANT
SUT VICRYL 0 TIES 12 18 (SUTURE) ×5 IMPLANT
SUT VICRYL 0 UR6 27IN ABS (SUTURE) ×5 IMPLANT
SYR BULB IRRIGATION 50ML (SYRINGE) IMPLANT
TOWEL OR 17X24 6PK STRL BLUE (TOWEL DISPOSABLE) ×10 IMPLANT
TRAY FOLEY CATH SILVER 14FR (SET/KITS/TRAYS/PACK) ×5 IMPLANT
TROCAR OPTI TIP 5M 100M (ENDOMECHANICALS) ×5 IMPLANT
TROCAR XCEL NON-BLD 11X100MML (ENDOMECHANICALS) ×5 IMPLANT
WARMER LAPAROSCOPE (MISCELLANEOUS) ×5 IMPLANT
WATER STERILE IRR 1000ML POUR (IV SOLUTION) IMPLANT

## 2015-04-04 NOTE — Anesthesia Preprocedure Evaluation (Signed)
Anesthesia Evaluation  Patient identified by MRN, date of birth, ID band Patient awake    Reviewed: Allergy & Precautions, NPO status , Patient's Chart, lab work & pertinent test results  Airway Mallampati: II   Neck ROM: Full    Dental  (+) Teeth Intact, Dental Advisory Given   Pulmonary neg pulmonary ROS,    breath sounds clear to auscultation       Cardiovascular + dysrhythmias Supra Ventricular Tachycardia  Rhythm:Regular  HX SVT, Controlled by vagal, followed by cardiology, negative STRESS and ECHO in past   Neuro/Psych negative neurological ROS     GI/Hepatic negative GI ROS, Neg liver ROS,   Endo/Other  negative endocrine ROS  Renal/GU negative Renal ROS     Musculoskeletal negative musculoskeletal ROS (+)   Abdominal (+)  Abdomen: soft.    Peds  Hematology negative hematology ROS (+)   Anesthesia Other Findings   Reproductive/Obstetrics                             Anesthesia Physical Anesthesia Plan  ASA: II  Anesthesia Plan: General   Post-op Pain Management:    Induction: Intravenous  Airway Management Planned: Oral ETT  Additional Equipment:   Intra-op Plan:   Post-operative Plan:   Informed Consent: I have reviewed the patients History and Physical, chart, labs and discussed the procedure including the risks, benefits and alternatives for the proposed anesthesia with the patient or authorized representative who has indicated his/her understanding and acceptance.     Plan Discussed with:   Anesthesia Plan Comments:         Anesthesia Quick Evaluation

## 2015-04-04 NOTE — Anesthesia Procedure Notes (Signed)
Procedure Name: Intubation Date/Time: 04/04/2015 12:27 PM Performed by: Harriett RushADELOYE, Orrin Yurkovich A Pre-anesthesia Checklist: Patient identified, Emergency Drugs available, Suction available and Patient being monitored Patient Re-evaluated:Patient Re-evaluated prior to inductionOxygen Delivery Method: Circle system utilized Preoxygenation: Pre-oxygenation with 100% oxygen Intubation Type: IV induction Ventilation: Mask ventilation without difficulty Laryngoscope Size: Miller and 2 Grade View: Grade I Tube type: Oral Tube size: 7.0 mm Number of attempts: 1 Placement Confirmation: ETT inserted through vocal cords under direct vision,  positive ETCO2 and breath sounds checked- equal and bilateral Secured at: 21 cm Tube secured with: Tape Dental Injury: Teeth and Oropharynx as per pre-operative assessment

## 2015-04-04 NOTE — Brief Op Note (Signed)
04/04/2015  3:17 PM  PATIENT:  Alexis Herman  44 y.o. female  PRE-OPERATIVE DIAGNOSIS:  PELVIC PAIN  POST-OPERATIVE DIAGNOSIS:  PELVIC PAIN, endometriosis  PROCEDURE:  Procedure(s): LAPAROSCOPIC ASSISTED VAGINAL HYSTERECTOMY (N/A) BILATERAL SALPINGECTOMY (Bilateral) OOPHORECTOMY (Left)  SURGEON:  Surgeon(s) and Role:    * Marlow Baarsyanna Mikael Skoda, MD - Primary    * Levi AlandMark E Anderson, MD - Assisting  ANESTHESIA:   general  EBL:  Total I/O In: 2000 [I.V.:2000] Out: 600 [Urine:350; Blood:250]  BLOOD ADMINISTERED:none  DRAINS: none   LOCAL MEDICATIONS USED:  MARCAINE     SPECIMEN:  Source of Specimen:  uterus, cervix, bilateral tubes, left ovary  DISPOSITION OF SPECIMEN:  PATHOLOGY  COUNTS:  YES  TOURNIQUET:  * No tourniquets in log *  DICTATION: .Note written in EPIC  PLAN OF CARE: Admit for overnight observation  PATIENT DISPOSITION:  PACU - hemodynamically stable.   Delay start of Pharmacological VTE agent (>24hrs) due to surgical blood loss or risk of bleeding: yes

## 2015-04-04 NOTE — Transfer of Care (Signed)
Immediate Anesthesia Transfer of Care Note  Patient: Curt BearsJennifer J Talerico  Procedure(s) Performed: Procedure(s): LAPAROSCOPIC ASSISTED VAGINAL HYSTERECTOMY (N/A) BILATERAL SALPINGECTOMY (Bilateral) OOPHORECTOMY (Left)  Patient Location: PACU  Anesthesia Type:General  Level of Consciousness: awake, alert  and oriented  Airway & Oxygen Therapy: Patient Spontanous Breathing and Patient connected to nasal cannula oxygen  Post-op Assessment: Report given to RN and Post -op Vital signs reviewed and stable  Post vital signs: Reviewed and stable  Last Vitals:  Filed Vitals:   04/04/15 1103  BP: 107/71  Pulse: 82  Temp: 36.7 C  Resp: 20    Complications: No apparent anesthesia complications

## 2015-04-04 NOTE — Anesthesia Postprocedure Evaluation (Signed)
Anesthesia Post Note  Patient: Alexis Herman  Procedure(s) Performed: Procedure(s) (LRB): LAPAROSCOPIC ASSISTED VAGINAL HYSTERECTOMY (N/A) BILATERAL SALPINGECTOMY (Bilateral) OOPHORECTOMY (Left)  Patient location during evaluation: PACU Anesthesia Type: General Level of consciousness: awake and alert Pain management: pain level controlled Vital Signs Assessment: post-procedure vital signs reviewed and stable Respiratory status: spontaneous breathing, nonlabored ventilation, respiratory function stable and patient connected to nasal cannula oxygen Cardiovascular status: blood pressure returned to baseline and stable Postop Assessment: no signs of nausea or vomiting Anesthetic complications: no    Last Vitals:  Filed Vitals:   04/04/15 1103  BP: 107/71  Pulse: 82  Temp: 36.7 C  Resp: 20    Last Pain: There were no vitals filed for this visit.               Arissa Fagin

## 2015-04-05 ENCOUNTER — Encounter (HOSPITAL_COMMUNITY): Payer: Self-pay | Admitting: Obstetrics

## 2015-04-05 DIAGNOSIS — N946 Dysmenorrhea, unspecified: Secondary | ICD-10-CM | POA: Diagnosis not present

## 2015-04-05 LAB — BASIC METABOLIC PANEL
ANION GAP: 5 (ref 5–15)
BUN: 7 mg/dL (ref 6–20)
CALCIUM: 8.4 mg/dL — AB (ref 8.9–10.3)
CO2: 26 mmol/L (ref 22–32)
CREATININE: 0.63 mg/dL (ref 0.44–1.00)
Chloride: 106 mmol/L (ref 101–111)
GFR calc Af Amer: >60 mL/min (ref >60)
GLUCOSE: 142 mg/dL — AB (ref 65–99)
Potassium: 3.3 mmol/L — ABNORMAL LOW (ref 3.5–5.1)
Sodium: 137 mmol/L (ref 135–145)

## 2015-04-05 LAB — CBC
HEMATOCRIT: 31 % — AB (ref 36.0–46.0)
Hemoglobin: 10.3 g/dL — ABNORMAL LOW (ref 12.0–15.0)
MCH: 29.5 pg (ref 26.0–34.0)
MCHC: 33.2 g/dL (ref 30.0–36.0)
MCV: 88.8 fL (ref 78.0–100.0)
PLATELETS: 200 10*3/uL (ref 150–400)
RBC: 3.49 MIL/uL — ABNORMAL LOW (ref 3.87–5.11)
RDW: 12.6 % (ref 11.5–15.5)
WBC: 7.4 10*3/uL (ref 4.0–10.5)

## 2015-04-05 MED ORDER — POTASSIUM CHLORIDE CRYS ER 20 MEQ PO TBCR
40.0000 meq | EXTENDED_RELEASE_TABLET | Freq: Once | ORAL | Status: AC
Start: 2015-04-05 — End: 2015-04-05
  Administered 2015-04-05: 40 meq via ORAL
  Filled 2015-04-05: qty 2

## 2015-04-05 MED ORDER — OXYCODONE-ACETAMINOPHEN 5-325 MG PO TABS: 1.0000 | ORAL_TABLET | Freq: Four times a day (QID) | ORAL | Status: DC | PRN

## 2015-04-05 MED ORDER — IBUPROFEN 600 MG PO TABS: 600.0000 mg | ORAL_TABLET | Freq: Four times a day (QID) | ORAL | Status: DC | PRN

## 2015-04-05 NOTE — Op Note (Addendum)
Pre-Operative Diagnosis: Pelvic pain and endometriosis  Postoperative Diagnosis: Pelvic pain and endometriosis  Procedure: Laparoscopic assisted vaginal hysterectomy, bilateral salpingectomy, left oophorectomy  Surgeon: Dr. Marlow Baars  Assistant: Dr. Malva Limes  Anesthesia: General endotracheal anesthesia, 10 cc of 0.25% Marcaine injected infraumbilically and 15 cc of dilute pitressin  Operative Findings: Normal anteverted uterus.  Normal appearing right ovaria.  Right fallopian tube with paratubal cyst.  Left ovary with small endometrioma that ruptured during removal.  Left ovary adherent to left pelvic sidewall.  Numerous implants of endometriosis in the posterior cul de sac, bilateral pelvic sidewalls.  Ureters identified bilaterally with excellent peristalsis following hysterectomy  Specimen: uterus, cervix, bilateral tubes, left ovary  EBL: 250 cc  Description of the Procedure:  The patient was taken to the operating room, where general endotracheal anesthesia was obtained without difficulty. She was placed in the dorsal lithotomy position in [place] stirrups and exam under anesthesia was performed, and a small mobile uterus was appreciated. The patient was prepped and draped in the normal sterile fashion. A Foley catheter was inserted sterilely into the bladder. A bivalve speculum was placed into the vagina and acorn uterine manipulator was placed without difficulty. Attention was then turned to the abdomen. The scalple was used to make a 10 mm vertical incision at the base of the umbilicus. The 10/11 mm Optiview trocar was used to enter the abdomen under direct visualization. Entry was confirmed and the abdomen was insufflated with carbon dioxide. The abdomen was surveyed and findings noted as above.  Under direct visualization, two additional 5-mm ports were placed into the right and left lower quadrant. The uterus was elevated out of the pelvis.     The LigaSure was then introduced  into the abdomen. The left ureter was identified and noted to be well below the level of the infundibulopelvic ligament. At this point, the left IP was clamped, burned,and ligated with the LigaSure. The LigaSure was used to sequentially clamp, cut, and burn the mesosalpinx.  The left ovary was moderately adherent to the left pelvic sidewall.  Blunt dissection was used to gently peel the ovary from the sidewall, to allow further ligation of the ovary from the mesosalpinx. The round ligament was then taken in a similar fashion. The anterior leaf of the broad ligament was developed with the LigaSure and dissected down to the level of the bladder. The posterior leaf was taken in a similar manner. Attention was then turned to the right side. The right ureter was identified.  The The LigaSure was used to sequentially clamp, cut, and burn the mesosalpinx, freeing the fallopian tube from the mesosalpinx and the utero-ovarian ligament. The LigaSure was used to sequentially clamp, burn, and ligate the round ligament, taking the broad ligament down to level of the uterine arteries.    At this point, the decision was made to turn the procedure to a vaginal approach.  All instruments were removed from the abdomen. Attention was turned to the vagina. The acorn uterine manipulator was removed. Two single-tooth tenacula were placed into the cervix. The cervix was injected circumferentially with  dilute Pitressin. Bovie cautery was used to circumferentially incise the cervicovaginal junction.  The posterior cul de sac was entered with curved mayo scissors.  The uterosacral ligaments were clamped with Heaney clamps, cut, and suture ligated bilaterally using #0-vicryl suture.  Curved Heaneys were obtained to sequentially clamp, cut, and suture ligate working up the cardinal ligaments until entry could be gained anteriorly into the peritoneum. Upon  entry into the anterior peritoneum, two additional pedicles were created with Heaney  clamps, suture ligated,and the uterus and the specimen was removed through the vagina. There was noted to be bleeding from a pedicle at the right edge of the cuff.  The artery was clamped and suture ligated with excellent hemostasis.  At this time, the pedicles were reexamined and all were noted to be hemostatic. The vaginal cuff was closed with#0 Vicryl in a running, locked fashion.  Attention was then turned back to the abdomen. The camera was reinserted. The abdomen was surveyed. It was copiously irrigated. The vaginal cuff was re-examined and noted to be hemostatic, with the exception of a small area of the posterior peritoneum.  There was a small steady trickle of heme noted.  The decision was made to place an additional figure of eight suture at the inferior edge of the vaginal cuff. The pedicles on the left and right side were examined and noted to be hemostatic.  Both ureters were identified and peristalsis was noted bilaterally.  At this time, all instruments were removed from the abdomen.  The fascia at the umbilicus was closed with #0-vicryl in a running fashion.  The skin was closed with 4-0 Monocryl in a subcuticular fashion.  The smaller skin incisions were closed with Dermabond.  A weighted speculum was placed back into the vagina along with a deaver retractor.  There was a small amount of bleeding from the lower edge of the cuff.  The patient was placed into steep Trendelenburg.   A figure of eight suture was placed at the inferior aspect of the vaginal cuff.  The cuff was now hemostatic.  All instruments were removed from the vagina. The patient tolerated all portions of procedure well. Sponge, lap, and needle count were correct x2.

## 2015-04-05 NOTE — Discharge Instructions (Signed)
You may wash incision with soap and water.    Do not soak the incision for 2 weeks (no tub baths or swimming).    Keep incision dry.  If you note drainage, increased pain, or increased redness of the incision, then please notify your physician.  Pelvic rest x 6 weeks (no intercourse or tampons)   No lifting over 10 lbs for 6 weeks.   Do not drive until you are not taking narcotic pain medication AND you can comfortably slam on the brakes.

## 2015-04-05 NOTE — Progress Notes (Signed)
Pt d/c home teaching complete  Has voided x2

## 2015-04-05 NOTE — Discharge Summary (Signed)
Physician Discharge Summary  Patient ID: Alexis Herman MRN: [id number] DOB/AGE: [birth year] 44 y.o.  Admit date: 04/04/2015 Discharge date: 04/05/2015  Admission Diagnoses: Endometriosis and pelvic pain  Discharge Diagnoses: Endometriosis and pelvic pain Active Problems:   Endometriosis   Discharged Condition: good  Hospital Course: Patient was taken to the OR where she underwent LAVH, b/l salpingectomy and LSO.  She had the expected EBL.  Her post operative course was uncomplicated.  On POD#1, she was ambulating, voiding, tolerating a regular diet without nausea and vomiting.  Bleeding was minimal.  Pain was controlled on PO medications.  She was afebrile, vital signs were stable, and urine output was excellent.  Potassium was slightly low at 3.3.  This was replaced prior to discharge.  Hemoglobin was stable at 10.3 and creatinine remained normal at 0.6.       Discharge Exam: Blood pressure 99/53, pulse 97, temperature 98.6 F (37 C), temperature source Oral, resp. rate 16, height 5\' 4"  (1.626 m), weight 67.132 kg (148 lb), last menstrual period 03/20/2015, SpO2 96 %. General appearance: alert and cooperative GI: soft, NT, ND, incisions c/d/i  Disposition: 01-Home or Self Care     Medication List    TAKE these medications        ibuprofen 600 MG tablet  Commonly known as:  ADVIL,MOTRIN  Take 1 tablet (600 mg total) by mouth every 6 (six) hours as needed for moderate pain or cramping.     multivitamin with minerals Tabs tablet  Take 1 tablet by mouth daily.     oxyCODONE-acetaminophen 5-325 MG tablet  Commonly known as:  PERCOCET/ROXICET  Take 1-2 tablets by mouth every 6 (six) hours as needed (moderate to severe pain (when tolerating fluids)).       Follow-up Information    [facility] Mikalyn Hermida, MD.   Specialty:  Obstetrics   Contact information:   [address] [place] [address] [phone]       Signed: Carmelina DaneDYANNA GEFFEL  [name] 04/05/2015, 9:22 AM

## 2015-04-05 NOTE — Addendum Note (Signed)
Addendum  created 04/05/15 0930 by Earmon PhoenixValerie P Romey Mathieson, CRNA   Modules edited: Clinical Notes   Clinical Notes:  File: [id number]

## 2015-04-05 NOTE — Anesthesia Postprocedure Evaluation (Signed)
Anesthesia Post Note  Patient: Curt BearsJennifer J Criado  Procedure(s) Performed: Procedure(s) (LRB): LAPAROSCOPIC ASSISTED VAGINAL HYSTERECTOMY (N/A) BILATERAL SALPINGECTOMY (Bilateral) OOPHORECTOMY (Left)  Patient location during evaluation: Mother Baby Anesthesia Type: General Level of consciousness: awake and alert Pain management: pain level controlled Vital Signs Assessment: post-procedure vital signs reviewed and stable Respiratory status: spontaneous breathing Cardiovascular status: stable Postop Assessment: no signs of nausea or vomiting and adequate PO intake Anesthetic complications: no    Last Vitals:  Filed Vitals:   04/05/15 0555 04/05/15 0922  BP: 99/53 98/52  Pulse: 97 104  Temp: 37 C 37.3 C  Resp: 16 16    Last Pain:  Filed Vitals:   04/05/15 0923  PainSc: 1                  Edison PaceWILKERSON,Jozette Castrellon

## 2015-04-05 NOTE — Progress Notes (Signed)
Patient is doing well.  She is tolerating PO without nausea or vomiting, ambulating without difficulty.  Pain is controlled.  Bleeding is minimal.  Foley catheter removed in the last hour--awaiting void  Filed Vitals:   04/04/15 1855 04/04/15 2202 04/05/15 0145 04/05/15 0555  BP:  123/61 93/50 99/53   Pulse:  71 102 97  Temp:  98 F (36.7 C) 98.6 F (37 C) 98.6 F (37 C)  TempSrc:  Oral Oral Oral  Resp:  16 16 16   Height:      Weight:      SpO2: 97% 98% 94% 96%   Urine output > 100cc/hr  NAD Lungs:   clear to auscultation Heart:   RRR Abdomen:  soft, appropriate tenderness, incisions intact and without erythema or drainage.  Peripad w scant heme ext:    Symmetric, no edema bilaterally  Lab Results  Component Value Date   WBC 7.4 04/05/2015   HGB 10.3* 04/05/2015   HCT 31.0* 04/05/2015   MCV 88.8 04/05/2015   PLT 200 04/05/2015     A/P    44 y.o. POD#1 s/p LAVH, b/l salpingectomy, LSO Routine post op and postpartum care. K 3.3--will replete prior to discharge Meeting all goals--awaiting void.  Will d/c to home after void

## 2015-06-08 ENCOUNTER — Other Ambulatory Visit: Payer: Self-pay

## 2015-06-08 DIAGNOSIS — Z1231 Encounter for screening mammogram for malignant neoplasm of breast: Secondary | ICD-10-CM

## 2015-07-13 ENCOUNTER — Ambulatory Visit
Admission: RE | Admit: 2015-07-13 | Discharge: 2015-07-13 | Disposition: A | Discharge status: Home / Self Care | Payer: BLUE CROSS/BLUE SHIELD | Source: Ambulatory Visit

## 2015-07-13 DIAGNOSIS — Z1231 Encounter for screening mammogram for malignant neoplasm of breast: Secondary | ICD-10-CM

## 2015-10-24 DIAGNOSIS — J302 Other seasonal allergic rhinitis: Secondary | ICD-10-CM | POA: Diagnosis not present

## 2015-10-24 DIAGNOSIS — J0100 Acute maxillary sinusitis, unspecified: Secondary | ICD-10-CM | POA: Diagnosis not present

## 2015-10-24 DIAGNOSIS — H9202 Otalgia, left ear: Secondary | ICD-10-CM | POA: Diagnosis not present

## 2015-10-26 DIAGNOSIS — J029 Acute pharyngitis, unspecified: Secondary | ICD-10-CM | POA: Diagnosis not present

## 2016-05-07 DIAGNOSIS — J343 Hypertrophy of nasal turbinates: Secondary | ICD-10-CM | POA: Diagnosis not present

## 2016-05-07 DIAGNOSIS — J310 Chronic rhinitis: Secondary | ICD-10-CM | POA: Diagnosis not present

## 2016-05-11 ENCOUNTER — Ambulatory Visit (INDEPENDENT_AMBULATORY_CARE_PROVIDER_SITE_OTHER): Payer: BLUE CROSS/BLUE SHIELD | Admitting: Cardiology

## 2016-05-11 ENCOUNTER — Encounter: Payer: Self-pay | Admitting: Cardiology

## 2016-05-11 VITALS — BP 104/68 | HR 86 | Ht 62.0 in | Wt 152.0 lb

## 2016-05-11 DIAGNOSIS — I471 Supraventricular tachycardia: Secondary | ICD-10-CM | POA: Diagnosis not present

## 2016-05-11 NOTE — Progress Notes (Signed)
Cardiology Office Note   Date:  05/11/2016   ID:  Alexis ClontsJennifer J Herman, DOB 04-08-1971, MRN [id number]  PCP:  No PCP Per Patient  Cardiologist:   Donato SchultzMark Javelle Donigan, MD       History of Present Illness: Alexis Herman is a 46 y.o. female here for follow-up of increasing supraventricular tachycardia. Previously she was seen with supraventricular tachycardia last seen by me on 07/01/2009. Previously she was thought to have possible AV nodal reentrant tachycardia with prior EKG showing heart rate of 150 bpm with possible retrograde P wave. Previously was in the emergency room given Lopressor fluids Cardizem. Very rarely has episodes. TSH in the past has been normal. Echo in the past has been unremarkable. Prior SVT responded to vagal maneuvers. Has had relative hypotension in the past with some dizziness with medications. Previously we spoke about ablation therapy.  Continues to work at [employer].  05/11/16 - she is beginning to experience more frequent episodes of supraventricular tachycardia lasting longer, longest episode occurred Saturday all night into Sunday afternoon. Her vagal maneuvers are not working all the time to terminate them. She occasionally will feel a "catch "before they start. Sometimes she feels a slight skipped beat without them starting. Likely PACs. When she is having her heart rate elevated, she feels them regular, her Apple watch his showing heart rates of approximately 170.   Took metop daily in the past but low BP felt faint. No longer feeling.    Past Medical History:  Diagnosis Date  . Irritable bowel syndrome (IBS)    diet controlled  . Tachycardia    occasional    Past Surgical History:  Procedure Laterality Date  . BILATERAL SALPINGECTOMY Bilateral 04/04/2015   Procedure: BILATERAL SALPINGECTOMY;  Surgeon: Marlow Baarsyanna Clark, MD;  Location: [facility];  Service: Gynecology;  Laterality: Bilateral;  . CESAREAN SECTION  2004   x 1 twins  . LAPAROSCOPIC  ASSISTED VAGINAL HYSTERECTOMY N/A 04/04/2015   Procedure: LAPAROSCOPIC ASSISTED VAGINAL HYSTERECTOMY;  Surgeon: Marlow Baarsyanna Clark, MD;  Location: [facility];  Service: Gynecology;  Laterality: N/A;  . LAPAROSCOPY N/A 06/01/2014   Procedure: LAPAROSCOPY OPERATIVE;  Surgeon: Essie HartWalda Pinn, MD;  Location: [facility];  Service: Gynecology;  Laterality: N/A;  . OOPHORECTOMY Left 04/04/2015   Procedure: OOPHORECTOMY;  Surgeon: Marlow Baarsyanna Clark, MD;  Location: [facility];  Service: Gynecology;  Laterality: Left;  . TONSILLECTOMY    . WISDOM TOOTH EXTRACTION       Current Outpatient Prescriptions  Medication Sig Dispense Refill  . Multiple Vitamin (MULTIVITAMIN WITH MINERALS) TABS tablet Take 1 tablet by mouth daily.     No current facility-administered medications for this visit.     Allergies:   Patient has no known allergies.    Social History:  The patient  reports that she has never smoked. She has never used smokeless tobacco. She reports that she drinks alcohol. She reports that she does not use drugs.   Family History:  The patient's family history includes COPD in her father; Cancer in her mother.  Mother has AFIB. Father no CAD.  ROS:  Please see the history of present illness.   Otherwise, review of systems are positive for none.   All other systems are reviewed and negative.    PHYSICAL EXAM: VS:  BP 104/68   Pulse 86   Ht 5\' 2"  (1.575 m)   Wt 152 lb (68.9 kg)   LMP  (LMP Unknown) Comment: Dec 2016  BMI 27.80 kg/m  ,  BMI Body mass index is 27.8 kg/m. GEN: Well nourished, well developed, in no acute distress  HEENT: normal  Neck: no JVD, carotid bruits, or masses Cardiac: RRR; no murmurs, rubs, or gallops,no edema  Respiratory:  clear to auscultation bilaterally, normal work of breathing GI: soft, nontender, nondistended, + BS MS: no deformity or atrophy  Skin: warm and dry, no rash Neuro:  Strength and sensation are intact Psych: euthymic mood, full affect   EKG:  Today 05/11/16-sinus  rhythm, 86, no other significant abnormalities. Personally viewed-prior 02/18/15-sinus rhythm, 78, normal intervals.  Recent Labs: No results found for requested labs within last 8760 hours.    Lipid Panel No results found for: CHOL, TRIG, HDL, CHOLHDL, VLDL, LDLCALC, LDLDIRECT    Wt Readings from Last 3 Encounters:  05/11/16 152 lb (68.9 kg)  04/04/15 148 lb (67.1 kg)  04/01/15 148 lb 6 oz (67.3 kg)      Other studies Reviewed: Additional studies/ records that were reviewed today include: Prior office note, echo which was normal, lab work TSH was normal reviewed. EKG personally reviewed. Review of the above records demonstrates: as above   ASSESSMENT AND PLAN:  Supraventricular tachycardia-has had these episodes for quite some time.Now they're happening more frequently and with longer duration. In the past,she was able to terminate them usually with Valsalva maneuver, happen about twice a month, sometimes 5 minutes duration but have been longer.   We will recheck echocardiogram since it is been since 2011. Prior echocardiogram was normal.  Discussed case with Lewayne Bunting . He will be seeing her next week. Discussed with her ablation.   Disposition:    As needed with me following EP evaluation.  Signed, Donato Schultz, MD  05/11/2016 9:15 AM    [facility] [address] [place], [place], [place]  [address] Phone: [phone]; Fax: [phone]

## 2016-05-11 NOTE — Patient Instructions (Signed)
Medication Instructions:  The current medical regimen is effective;  continue present plan and medications.  Testing/Procedures: Your physician has requested that you have an echocardiogram. Echocardiography is a painless test that uses sound waves to create images of your heart. It provides your doctor with information about the size and shape of your heart and how well your heart's chambers and valves are working. This procedure takes approximately one hour. There are no restrictions for this procedure.  You have been referred to electrophysiology for the evaluation of PSVT.  Supraventricular Tachycardia, Adult Supraventricular tachycardia (SVT) is a kind of abnormal heartbeat. It makes your heart beat very fast and then beat at a normal speed. A normal heart beats 60-100 times a minute. This condition can make your heart beat more than 150 times a minute. Times of having a fast heartbeat (episodes) can be scary, but they are usually not dangerous. They can lead to problems if:  They happen often.  They last a long time. Symptoms of this condition include:  A pounding heart.  A feeling that your heart is skipping beats (palpitations).  Weakness.  Trouble getting enough air (shortness of breath).  Pain or tightness in your chest.  Feeling like you are going to pass out (light-headedness).  Feeling worried or nervous (anxiety).  Dizziness.  Sweating.  Feeling sick to your stomach (nausea).  Passing out (fainting).  Tiredness. Sometimes, there are no symptoms. Follow these instructions at home: Stress  Avoid things that make you feel stressed.  Find out what helps you feel less stressed. Try:  Doing a relaxing activity, like yoga, meditation, or being out in nature.  Listening to relaxing music.  Doing relaxation techniques, like deep breathing.  Taking steps to be healthy. These include getting lots of sleep, exercising, and eating a balanced diet.  Talking  with a mental health doctor. Sleep  Try to get at least 7 hours of sleep each night. Tobacco and nicotine  Do not use anything that has nicotine or tobacco, such as cigarettes and e-cigarettes. If you need help quitting, ask your doctor. Alcohol  If alcohol gives you a fast heartbeat, do not drink alcohol.  If alcohol does not seem to give you a fast heartbeat, limit your alcohol. For nonpregnant women, this means no more than 1 drink a day. For men, this means no more than 2 drinks a day. "One drink" means one of these:  12 oz of beer.  5 oz of wine.  1 oz of hard liquor. Caffeine  If caffeine gives you a fast heartbeat, do not eat, drink, or use anything with caffeine in it.  If caffeine does not seem to give you a fast heartbeat, limit how much caffeine you eat, drink, or use. Stimulant drugs  Do not use stimulant drugs. These are drugs like cocaine or methamphetamine. If you need help quitting, ask your doctor. General instructions  Stay at a healthy weight.  Exercise regularly. Ask your doctor to suggest some good activities for you. Try one of these options:  150 minutes a week of gentle exercise, like walking or yoga.  75 minutes a week of exercise that is very active, like running or swimming.  A combination of gentle exercise and very active exercise.  Do home treatments to slow down your heartbeat as told by your doctor.  Take over-the-counter and prescription medicines only as told by your doctor. Contact a doctor if:  You have a fast heartbeat more often.  Times of having  a fast heartbeat last longer than before.  Your home treatments to slow down your heartbeat do not help.  You have new symptoms. Get help right away if:  You have chest pain.  Your symptoms get worse.  You have trouble breathing.  Your heart beats very fast for more than 20 minutes.  You pass out (faint). These symptoms may be an emergency. Do not wait to see if the symptoms  will go away. Get medical help right away. Call your local emergency services (911 in the [place] Do not drive yourself to the hospital.  This information is not intended to replace advice given to you by your health care provider. Make sure you discuss any questions you have with your health care provider. Document Released: 04/16/2005 Document Revised: 12/22/2015 Document Reviewed: 12/22/2015 Elsevier Interactive Patient Education  2017 [employer]  Follow-Up: Follow up with Dr Anne FuSkains as needed.  Thank you for choosing [facility]!!

## 2016-05-17 ENCOUNTER — Institutional Professional Consult (permissible substitution): Payer: BLUE CROSS/BLUE SHIELD | Admitting: Internal Medicine

## 2016-05-22 ENCOUNTER — Ambulatory Visit (INDEPENDENT_AMBULATORY_CARE_PROVIDER_SITE_OTHER): Payer: BLUE CROSS/BLUE SHIELD | Admitting: Internal Medicine

## 2016-05-22 ENCOUNTER — Encounter: Payer: Self-pay | Admitting: Internal Medicine

## 2016-05-22 VITALS — BP 100/62 | HR 76 | Ht 64.0 in | Wt 152.8 lb

## 2016-05-22 DIAGNOSIS — I471 Supraventricular tachycardia: Secondary | ICD-10-CM | POA: Diagnosis not present

## 2016-05-22 NOTE — Patient Instructions (Addendum)
Medication Instructions:  Your physician recommends that you continue on your current medications as directed. Please refer to the Current Medication list given to you today.   Labwork: BMET / CBC w/ diff 1-2 weeks prior to procedure   Testing/Procedures: Your physician has recommended that you have an ablation. Catheter ablation is a medical procedure used to treat some cardiac arrhythmias (irregular heartbeats). During catheter ablation, a long, thin, flexible tube is put into a blood vessel in your groin (upper thigh), or neck. This tube is called an ablation catheter. It is then guided to your heart through the blood vessel. Radio frequency waves destroy small areas of heart tissue where abnormal heartbeats may cause an arrhythmia to start. Please see the instruction sheet given to you today.   Possible dates: 1/29, 2/12, 2/23, 3/1, 3/7, 3/19, 3/26  Follow-Up: Follow-up to be determined  Any Other Special Instructions Will Be Listed Below (If Applicable).     If you need a refill on your cardiac medications before your next appointment, please call your pharmacy.

## 2016-05-23 NOTE — Progress Notes (Signed)
HPI Alexis Herman is referred today by Dr. Anne FuSkains for evaluation of SVT. She is a pleasant 46 yo woman whose history of SVT dates back almost 20 years. Over time she has had to go to the ER on a couple of occaisions and received IV adenosine, terminating her SVT. Mostly she just waits the episodes out and will sometimes go to sleep with her heart racing and then awaken in the morining back in NSR. The patient experiences weakness and fatigue and sob with the episodes. She has had documented narrow QRS tachycardia with these. She has an Apple watch which documents her episodes as 160-180/min. No Known Allergies   Current Outpatient Prescriptions  Medication Sig Dispense Refill  . Multiple Vitamin (MULTIVITAMIN WITH MINERALS) TABS tablet Take 1 tablet by mouth daily.     No current facility-administered medications for this visit.      Past Medical History:  Diagnosis Date  . Irritable bowel syndrome (IBS)    diet controlled  . Tachycardia    occasional    ROS:   All systems reviewed and negative except as noted in the HPI.   Past Surgical History:  Procedure Laterality Date  . BILATERAL SALPINGECTOMY Bilateral 04/04/2015   Procedure: BILATERAL SALPINGECTOMY;  Surgeon: Marlow Baarsyanna Clark, MD;  Location: [facility];  Service: Gynecology;  Laterality: Bilateral;  . CESAREAN SECTION  2004   x 1 twins  . LAPAROSCOPIC ASSISTED VAGINAL HYSTERECTOMY N/A 04/04/2015   Procedure: LAPAROSCOPIC ASSISTED VAGINAL HYSTERECTOMY;  Surgeon: Marlow Baarsyanna Clark, MD;  Location: [facility];  Service: Gynecology;  Laterality: N/A;  . LAPAROSCOPY N/A 06/01/2014   Procedure: LAPAROSCOPY OPERATIVE;  Surgeon: Essie HartWalda Pinn, MD;  Location: [facility];  Service: Gynecology;  Laterality: N/A;  . OOPHORECTOMY Left 04/04/2015   Procedure: OOPHORECTOMY;  Surgeon: Marlow Baarsyanna Clark, MD;  Location: [facility];  Service: Gynecology;  Laterality: Left;  . TONSILLECTOMY    . WISDOM TOOTH EXTRACTION       Family History  Problem Relation Age  of Onset  . Cancer Mother     breast  . COPD Father      Social History   Social History  . Marital status: Married    Spouse name: N/A  . Number of children: N/A  . Years of education: N/A   Occupational History  . Not on file.   Social History Main Topics  . Smoking status: Never Smoker  . Smokeless tobacco: Never Used  . Alcohol use Yes     Comment: occasional  . Drug use: No  . Sexual activity: Yes    Birth control/ protection: None   Other Topics Concern  . Not on file   Social History Narrative  . No narrative on file     BP 100/62   Pulse 76   Ht 5\' 4"  (1.626 m)   Wt 152 lb 12.8 oz (69.3 kg)   LMP  (LMP Unknown) Comment: Dec 2016  BMI 26.23 kg/m   Physical Exam:  Well appearing middle aged woman, NAD HEENT: Unremarkable Neck:  6 cm JVD, no thyromegally Lymphatics:  No adenopathy Back:  No CVA tenderness Lungs:  Clear with no wheezes HEART:  Regular rate rhythm, no murmurs, no rubs, no clicks Abd:  soft, positive bowel sounds, no organomegally, no rebound, no guarding Ext:  2 plus pulses, no edema, no cyanosis, no clubbing Skin:  No rashes no nodules Neuro:  CN II through XII intact, motor grossly intact  EKG - nsr with no  pre-excitation  Assess/Plan: 1. SVT - I have discussed the treatment options with the patient. The risks/benefits/goals/expectations of EP study and catheter ablation of SVT were reviewed. She will call us if she wishes to proceed with ablation.  Lewayne Bunting, M.D.

## 2016-05-24 ENCOUNTER — Telehealth: Payer: Self-pay | Admitting: Internal Medicine

## 2016-05-24 DIAGNOSIS — I471 Supraventricular tachycardia: Secondary | ICD-10-CM

## 2016-05-24 DIAGNOSIS — Z01812 Encounter for preprocedural laboratory examination: Secondary | ICD-10-CM

## 2016-05-24 NOTE — Telephone Encounter (Signed)
New Message  Pt voiced she is returning nurses call.  Date 3.7.18 for cardiac ablation  Please f/u if needed

## 2016-05-24 NOTE — Telephone Encounter (Signed)
Called, spoke with pt. Informed cardiac ablation is scheduled for 07/04/16 [facility]30 [facility]. Reviewed all information in Ablation Procedure Instructions letter in pt's chart. Pt has lab appt scheduled for [facility]/29/16 at [facility]. Informed pt Dr. Ladona Ridgelaylor will need to see pt 4-6 weeks for a f/u ov following ablation. Mailed pt instruction letter today. Informed if pt has questions or concerns please call. Pt verbalized understanding.

## 2016-05-28 ENCOUNTER — Other Ambulatory Visit (HOSPITAL_COMMUNITY): Payer: BLUE CROSS/BLUE SHIELD

## 2016-06-29 ENCOUNTER — Other Ambulatory Visit: Payer: BLUE CROSS/BLUE SHIELD | Admitting: *Deleted

## 2016-06-29 DIAGNOSIS — I471 Supraventricular tachycardia: Secondary | ICD-10-CM

## 2016-06-29 LAB — BASIC METABOLIC PANEL
BUN/Creatinine Ratio: 18 (ref 9–23)
BUN: 14 mg/dL (ref 6–24)
CALCIUM: 9.7 mg/dL (ref 8.7–10.2)
CO2: 24 mmol/L (ref 18–29)
CREATININE: 0.78 mg/dL (ref 0.57–1.00)
Chloride: 99 mmol/L (ref 96–106)
GFR calc Af Amer: 106 mL/min/{1.73_m2} (ref >59)
GFR, EST NON AFRICAN AMERICAN: 92 mL/min/{1.73_m2} (ref >59)
Glucose: 99 mg/dL (ref 65–99)
Potassium: 4.1 mmol/L (ref 3.5–5.2)
Sodium: 139 mmol/L (ref 134–144)

## 2016-06-29 LAB — CBC WITH DIFFERENTIAL/PLATELET
BASOS: 1 %
Basophils Absolute: 0 10*3/uL (ref 0.0–0.2)
EOS (ABSOLUTE): 0.2 10*3/uL (ref 0.0–0.4)
EOS: 3 %
Hematocrit: 37.7 % (ref 34.0–46.6)
Hemoglobin: 12.6 g/dL (ref 11.1–15.9)
IMMATURE GRANS (ABS): 0 10*3/uL (ref 0.0–0.1)
Immature Granulocytes: 0 %
Lymphocytes Absolute: 1.6 10*3/uL (ref 0.7–3.1)
Lymphs: 31 %
MCH: 29.6 pg (ref 26.6–33.0)
MCHC: 33.4 g/dL (ref 31.5–35.7)
MCV: 89 fL (ref 79–97)
MONOCYTES: 11 %
Monocytes Absolute: 0.5 10*3/uL (ref 0.1–0.9)
NEUTROS PCT: 54 %
Neutrophils Absolute: 2.8 10*3/uL (ref 1.4–7.0)
PLATELETS: 281 10*3/uL (ref 150–379)
RBC: 4.26 x10E6/uL (ref 3.77–5.28)
RDW: 13.7 % (ref 12.3–15.4)
WBC: 5.1 10*3/uL (ref 3.4–10.8)

## 2016-07-03 ENCOUNTER — Encounter (HOSPITAL_COMMUNITY): Payer: Self-pay | Admitting: *Deleted

## 2016-07-03 ENCOUNTER — Telehealth: Payer: Self-pay | Admitting: Internal Medicine

## 2016-07-03 NOTE — Telephone Encounter (Signed)
Patient is scheduled for an procedure tomorrow and would like some instructions. Please call to discuss, thanks.

## 2016-07-03 NOTE — Telephone Encounter (Signed)
Called, spoke with pt. Reviewd pre-procedure instructions for ablation scheduled for tomorrow at 6:30 AM. Reviewed ALL instructions on pt instructions letter. Informed of f/u appt scheduled for 08/02/16. Pt verbalized understanding and agreed with plan.

## 2016-07-04 ENCOUNTER — Encounter (HOSPITAL_COMMUNITY)
Admission: RE | Disposition: A | Discharge status: Home / Self Care | Payer: Self-pay | Source: Ambulatory Visit | Attending: Internal Medicine

## 2016-07-04 ENCOUNTER — Encounter (HOSPITAL_COMMUNITY): Payer: Self-pay | Admitting: Internal Medicine

## 2016-07-04 ENCOUNTER — Ambulatory Visit (HOSPITAL_COMMUNITY)
Admission: RE | Admit: 2016-07-04 | Discharge: 2016-07-04 | Disposition: A | Discharge status: Home / Self Care | Payer: BLUE CROSS/BLUE SHIELD | Source: Ambulatory Visit | Attending: Internal Medicine | Admitting: Internal Medicine

## 2016-07-04 DIAGNOSIS — K589 Irritable bowel syndrome without diarrhea: Secondary | ICD-10-CM | POA: Insufficient documentation

## 2016-07-04 DIAGNOSIS — I471 Supraventricular tachycardia, unspecified: Secondary | ICD-10-CM | POA: Diagnosis present

## 2016-07-04 HISTORY — PX: SVT ABLATION: EP1225

## 2016-07-04 SURGERY — SVT ABLATION
Anesthesia: LOCAL

## 2016-07-04 MED ORDER — FENTANYL CITRATE (PF) 100 MCG/2ML IJ SOLN
INTRAMUSCULAR | Status: DC | PRN
  Administered 2016-07-04: 25 ug via INTRAVENOUS
  Administered 2016-07-04 (×2): 12.5 ug via INTRAVENOUS
  Administered 2016-07-04 (×2): 25 ug via INTRAVENOUS
  Administered 2016-07-04: 12.5 ug via INTRAVENOUS
  Administered 2016-07-04: 25 ug via INTRAVENOUS
  Administered 2016-07-04: 12.5 ug via INTRAVENOUS

## 2016-07-04 MED ORDER — MIDAZOLAM HCL 5 MG/5ML IJ SOLN
INTRAMUSCULAR | Status: AC
  Filled 2016-07-04: qty 5

## 2016-07-04 MED ORDER — SODIUM CHLORIDE 0.9% FLUSH: 3.0000 mL | INTRAVENOUS | Status: DC | PRN

## 2016-07-04 MED ORDER — SODIUM CHLORIDE 0.9% FLUSH
3.0000 mL | Freq: Two times a day (BID) | INTRAVENOUS | Status: DC
  Administered 2016-07-04: 3 mL via INTRAVENOUS

## 2016-07-04 MED ORDER — MIDAZOLAM HCL 5 MG/5ML IJ SOLN
INTRAMUSCULAR | Status: DC | PRN
  Administered 2016-07-04 (×3): 2 mg via INTRAVENOUS
  Administered 2016-07-04 (×2): 1 mg via INTRAVENOUS
  Administered 2016-07-04: 2 mg via INTRAVENOUS
  Administered 2016-07-04 (×2): 1 mg via INTRAVENOUS
  Administered 2016-07-04: 2 mg via INTRAVENOUS
  Administered 2016-07-04 (×2): 1 mg via INTRAVENOUS

## 2016-07-04 MED ORDER — HEPARIN (PORCINE) IN NACL 2-0.9 UNIT/ML-% IJ SOLN
INTRAMUSCULAR | Status: DC | PRN
  Administered 2016-07-04: 1000 mL

## 2016-07-04 MED ORDER — BUPIVACAINE HCL (PF) 0.25 % IJ SOLN
INTRAMUSCULAR | Status: AC
  Filled 2016-07-04: qty 60

## 2016-07-04 MED ORDER — SODIUM CHLORIDE 0.9 % IV SOLN
INTRAVENOUS | Status: DC
  Administered 2016-07-04: 08:00:00 via INTRAVENOUS

## 2016-07-04 MED ORDER — BUPIVACAINE HCL (PF) 0.25 % IJ SOLN
INTRAMUSCULAR | Status: DC | PRN
  Administered 2016-07-04: 60 mL

## 2016-07-04 MED ORDER — FENTANYL CITRATE (PF) 100 MCG/2ML IJ SOLN
INTRAMUSCULAR | Status: AC
  Filled 2016-07-04: qty 2

## 2016-07-04 MED ORDER — SODIUM CHLORIDE 0.9 % IV SOLN: 250.0000 mL | INTRAVENOUS | Status: DC | PRN

## 2016-07-04 MED ORDER — HEPARIN (PORCINE) IN NACL 2-0.9 UNIT/ML-% IJ SOLN
INTRAMUSCULAR | Status: AC
  Filled 2016-07-04: qty 500

## 2016-07-04 MED ORDER — ACETAMINOPHEN 325 MG PO TABS: 650.0000 mg | ORAL_TABLET | ORAL | Status: DC | PRN

## 2016-07-04 MED ORDER — ONDANSETRON HCL 4 MG/2ML IJ SOLN: 4.0000 mg | Freq: Four times a day (QID) | INTRAMUSCULAR | Status: DC | PRN

## 2016-07-04 SURGICAL SUPPLY — 11 items
BAG SNAP BAND KOVER 36X36 (MISCELLANEOUS) ×2 IMPLANT
CATH CELSIUS THERM D CV 7F (ABLATOR) ×2 IMPLANT
CATH HEX JOS 2-5-2 65CM 6F REP (CATHETERS) ×2 IMPLANT
CATH JOSEPH QUAD ALLRED 6F REP (CATHETERS) ×4 IMPLANT
PACK EP LATEX FREE (CUSTOM PROCEDURE TRAY) ×1
PACK EP LF (CUSTOM PROCEDURE TRAY) ×1 IMPLANT
PAD DEFIB LIFELINK (PAD) ×2 IMPLANT
SHEATH PINNACLE 6F 10CM (SHEATH) ×4 IMPLANT
SHEATH PINNACLE 7F 10CM (SHEATH) ×4 IMPLANT
SHEATH PINNACLE 8F 10CM (SHEATH) ×2 IMPLANT
SHIELD RADPAD SCOOP 12X17 (MISCELLANEOUS) ×2 IMPLANT

## 2016-07-04 NOTE — Progress Notes (Addendum)
Site area: Right Internal Jugular a 7 french venous sheath was removed  Site Prior to Removal:  Level 0  Pressure Applied For 10 MINUTES    Bedrest Beginning at 1050a  Manual:   Yes.    Patient Status During Pull:  stable  Post Pull Groin Site:  Level 0  Post Pull Instructions Given:  Yes.    Post Pull Pulses Present:  Yes.    Dressing Applied:  Yes.    Comments:  VS remain stable during sheath pull

## 2016-07-04 NOTE — Progress Notes (Signed)
The patient was seen by Dr. Ladona Ridgelaylor post procedure Tele is SR VSS Patient feels well, no CP, palpitations or SOB Procedure sites are stable, dressing dry, no hematomas Activity restrictions were discussed with the patient She is cleared for discharge when bedrest is completed, after ambulation of sites remain stable Follow up has been arranged  Francis Dowseenee Ursuy, PA-C  EP Attending  Patient seen and examined. Her vitals and tele and groin are all stable. She may be discharged home when bedrest is complete.  Leonia ReevesGregg Taylor,M.D.

## 2016-07-04 NOTE — Progress Notes (Signed)
Patient ambulated in room unassisted. Patient denies complaints and appears stable.

## 2016-07-04 NOTE — Discharge Instructions (Signed)
No driving for 3 days. No lifting over 5 lbs for 1 week. No vigorous or sexual activity for 1 week. You may return to work on 07/11/16. Keep procedure site clean & dry. If you notice increased pain, swelling, bleeding or pus, call/return!  You may shower, but no soaking baths/hot tubs/pools for 1 week.  ° ° °

## 2016-07-04 NOTE — H&P (Signed)
HPI Alexis Herman is referred today by Dr. Anne Fu for evaluation of SVT. She is a pleasant 46 yo woman whose history of SVT dates back almost 20 years. Over time she has had to go to the ER on a couple of occaisions and received IV adenosine, terminating her SVT. Mostly she just waits the episodes out and will sometimes go to sleep with her heart racing and then awaken in the morining back in NSR. The patient experiences weakness and fatigue and sob with the episodes. She has had documented narrow QRS tachycardia with these. She has an Apple watch which documents her episodes as 160-180/min. No Known Allergies         Current Outpatient Prescriptions  Medication Sig Dispense Refill  . Multiple Vitamin (MULTIVITAMIN WITH MINERALS) TABS tablet Take 1 tablet by mouth daily.     No current facility-administered medications for this visit.          Past Medical History:  Diagnosis Date  . Irritable bowel syndrome (IBS)    diet controlled  . Tachycardia    occasional    ROS:   All systems reviewed and negative except as noted in the HPI.        Past Surgical History:  Procedure Laterality Date  . BILATERAL SALPINGECTOMY Bilateral 04/04/2015   Procedure: BILATERAL SALPINGECTOMY;  Surgeon: Marlow Baars, MD;  Location: [facility];  Service: Gynecology;  Laterality: Bilateral;  . CESAREAN SECTION  2004   x 1 twins  . LAPAROSCOPIC ASSISTED VAGINAL HYSTERECTOMY N/A 04/04/2015   Procedure: LAPAROSCOPIC ASSISTED VAGINAL HYSTERECTOMY;  Surgeon: Marlow Baars, MD;  Location: [facility];  Service: Gynecology;  Laterality: N/A;  . LAPAROSCOPY N/A 06/01/2014   Procedure: LAPAROSCOPY OPERATIVE;  Surgeon: Essie Hart, MD;  Location: [facility];  Service: Gynecology;  Laterality: N/A;  . OOPHORECTOMY Left 04/04/2015   Procedure: OOPHORECTOMY;  Surgeon: Marlow Baars, MD;  Location: [facility];  Service: Gynecology;  Laterality: Left;  . TONSILLECTOMY    . WISDOM TOOTH EXTRACTION              Family History  Problem Relation Age of Onset  . Cancer Mother     breast  . COPD Father      Social History        Social History  . Marital status: Married    Spouse name: N/A  . Number of children: N/A  . Years of education: N/A      Occupational History  . Not on file.         Social History Main Topics  . Smoking status: Never Smoker  . Smokeless tobacco: Never Used  . Alcohol use Yes     Comment: occasional  . Drug use: No  . Sexual activity: Yes    Birth control/ protection: None       Other Topics Concern  . Not on file      Social History Narrative  . No narrative on file     BP 100/62   Pulse 76   Ht 5\' 4"  (1.626 m)   Wt 152 lb 12.8 oz (69.3 kg)   LMP  (LMP Unknown) Comment: Dec 2016  BMI 26.23 kg/m   Physical Exam:  Well appearing middle aged woman, NAD HEENT: Unremarkable Neck:  6 cm JVD, no thyromegally Lymphatics:  No adenopathy Back:  No CVA tenderness Lungs:  Clear with no wheezes HEART:  Regular rate rhythm, no murmurs, no rubs, no clicks Abd:  soft, positive bowel sounds, no organomegally, no rebound, no guarding Ext:  2 plus pulses, no edema, no cyanosis, no clubbing Skin:  No rashes no nodules Neuro:  CN II through XII intact, motor grossly intact  EKG - nsr with no pre-excitation  Assess/Plan: 1. SVT - I have discussed the treatment options with the patient. The risks/benefits/goals/expectations of EP study and catheter ablation of SVT were reviewed. She will call us if she wishes to proceed with ablation.  Alexis Herman, M.D.   EP Attending  Since prior clinic visit, no change in the history, exam, assessment and plan. Will proceed with EP study and catheter ablation of SVT.  Alexis Herman,M.D.

## 2016-07-04 NOTE — Progress Notes (Addendum)
Site area: Right groin a 6 french X2, and 8 french venous sheath was removed  Site Prior to Removal:  Level 0  Pressure Applied For 20 MINUTES    Bedrest Beginning at 1050a  Manual:   Yes.    Patient Status During Pull:  stable  Post Pull Groin Site:  Level 0  Post Pull Instructions Given:  Yes.    Post Pull Pulses Present:  Yes.    Dressing Applied:  Yes.    Comments:  VS remain stable during sheath pull

## 2016-07-04 NOTE — Progress Notes (Signed)
Discharge instructions received by patient. Patient d/c to home with husband.

## 2016-07-31 ENCOUNTER — Other Ambulatory Visit: Payer: Self-pay | Admitting: Obstetrics & Gynecology

## 2016-07-31 DIAGNOSIS — Z1231 Encounter for screening mammogram for malignant neoplasm of breast: Secondary | ICD-10-CM

## 2016-08-02 ENCOUNTER — Ambulatory Visit (INDEPENDENT_AMBULATORY_CARE_PROVIDER_SITE_OTHER): Payer: BLUE CROSS/BLUE SHIELD | Admitting: Internal Medicine

## 2016-08-02 ENCOUNTER — Encounter: Payer: Self-pay | Admitting: Internal Medicine

## 2016-08-02 VITALS — BP 100/64 | HR 73 | Ht 64.0 in | Wt 154.6 lb

## 2016-08-02 DIAGNOSIS — I471 Supraventricular tachycardia: Secondary | ICD-10-CM

## 2016-08-02 NOTE — Progress Notes (Signed)
HPI Alexis Herman is referred today by Dr. Anne Herman for evaluation of SVT. She is a pleasant 46 yo woman whose history of SVT dates back almost 20 years. Over time she has had to go to the ER on a couple of occaisions and received IV adenosine, terminating her SVT. She underwent EP Study and catheter ablation several weeks ago. At that time she inducible AVNRT and successful slow pathway ablation was performed without complication. In the interim, she has done well with no palpitations.  No Known Allergies   Current Outpatient Prescriptions  Medication Sig Dispense Refill  . fluticasone (FLONASE) 50 MCG/ACT nasal spray Place 1 spray into both nostrils at bedtime.    . Multiple Vitamin (MULTIVITAMIN WITH MINERALS) TABS tablet Take 1 tablet by mouth daily.     No current facility-administered medications for this visit.      Past Medical History:  Diagnosis Date  . Irritable bowel syndrome (IBS)    diet controlled  . Tachycardia    occasional    ROS:   All systems reviewed and negative except as noted in the HPI.   Past Surgical History:  Procedure Laterality Date  . BILATERAL SALPINGECTOMY Bilateral 04/04/2015   Procedure: BILATERAL SALPINGECTOMY;  Surgeon: Marlow Baars, MD;  Location: [facility];  Service: Gynecology;  Laterality: Bilateral;  . CESAREAN SECTION  2004   x 1 twins  . LAPAROSCOPIC ASSISTED VAGINAL HYSTERECTOMY N/A 04/04/2015   Procedure: LAPAROSCOPIC ASSISTED VAGINAL HYSTERECTOMY;  Surgeon: Marlow Baars, MD;  Location: [facility];  Service: Gynecology;  Laterality: N/A;  . LAPAROSCOPY N/A 06/01/2014   Procedure: LAPAROSCOPY OPERATIVE;  Surgeon: Alexis Hart, MD;  Location: [facility];  Service: Gynecology;  Laterality: N/A;  . OOPHORECTOMY Left 04/04/2015   Procedure: OOPHORECTOMY;  Surgeon: Marlow Baars, MD;  Location: [facility];  Service: Gynecology;  Laterality: Left;  . SVT ABLATION N/A 07/04/2016   Procedure: SVT Ablation;  Surgeon: Alexis Maw, MD;  Location: [facility]  INVAS[facility];  Service: Cardiovascular;  Laterality: N/A;  . TONSILLECTOMY    . WISDOM TOOTH EXTRACTION       Family History  Problem Relation Age of Onset  . Cancer Mother     breast  . COPD Father      Social History   Social History  . Marital status: Married    Spouse name: N/A  . Number of children: N/A  . Years of education: N/A   Occupational History  . Not on file.   Social History Main Topics  . Smoking status: Never Smoker  . Smokeless tobacco: Never Used  . Alcohol use Yes     Comment: occasional  . Drug use: No  . Sexual activity: Yes    Birth control/ protection: None   Other Topics Concern  . Not on file   Social History Narrative  . No narrative on file     BP 100/64   Pulse 73   Ht  (1.626 m)   Wt 154 lb 9.6 oz (70.1 kg)   LMP  (LMP Unknown) Comment: Dec 2016  SpO2 99%   BMI 26.54 kg/m   Physical Exam:  Well appearing middle aged woman, NAD HEENT: Unremarkable Neck:  6 cm JVD, no thyromegally Lymphatics:  No adenopathy Back:  No CVA tenderness Lungs:  Clear with no wheezes HEART:  Regular rate rhythm, no murmurs, no rubs, no clicks Abd:  soft, positive bowel sounds, no organomegally, no rebound, no guarding Ext:  2  plus pulses, no edema, no cyanosis, no clubbing Skin:  No rashes no nodules Neuro:  CN II through XII intact, motor grossly intact  EKG - nsr with no pre-excitation  Assess/Plan: 1. SVT - she is doing well s/p ablation. She will continue her current meds, use ETOH and caffeine in moderation and followup with [place] as needed.   Alexis Herman, M.D.

## 2016-08-02 NOTE — Patient Instructions (Signed)
Medication Instructions:  Your physician recommends that you continue on your current medications as directed. Please refer to the Current Medication list given to you today.   Labwork: none  Testing/Procedures: none  Follow-Up: Your physician recommends that you schedule a follow-up appointment as needed.    Any Other Special Instructions Will Be Listed Below (If Applicable).     If you need a refill on your cardiac medications before your next appointment, please call your pharmacy.   

## 2016-08-17 DIAGNOSIS — Z01419 Encounter for gynecological examination (general) (routine) without abnormal findings: Secondary | ICD-10-CM | POA: Diagnosis not present

## 2016-08-17 DIAGNOSIS — Z6825 Body mass index (BMI) 25.0-25.9, adult: Secondary | ICD-10-CM | POA: Diagnosis not present

## 2016-08-22 ENCOUNTER — Ambulatory Visit
Admission: RE | Admit: 2016-08-22 | Discharge: 2016-08-22 | Disposition: A | Discharge status: Home / Self Care | Payer: BLUE CROSS/BLUE SHIELD | Source: Ambulatory Visit | Attending: Obstetrics & Gynecology | Admitting: Obstetrics & Gynecology

## 2016-08-22 DIAGNOSIS — Z1231 Encounter for screening mammogram for malignant neoplasm of breast: Secondary | ICD-10-CM | POA: Diagnosis not present

## 2016-12-07 ENCOUNTER — Encounter: Payer: Self-pay | Admitting: Family Medicine

## 2016-12-07 ENCOUNTER — Ambulatory Visit (INDEPENDENT_AMBULATORY_CARE_PROVIDER_SITE_OTHER): Payer: BLUE CROSS/BLUE SHIELD | Admitting: Family Medicine

## 2016-12-07 VITALS — BP 102/66 | HR 70 | Temp 98.0°F | Resp 16 | Ht 64.0 in | Wt 152.4 lb

## 2016-12-07 DIAGNOSIS — Z23 Encounter for immunization: Secondary | ICD-10-CM | POA: Diagnosis not present

## 2016-12-07 DIAGNOSIS — E663 Overweight: Secondary | ICD-10-CM | POA: Diagnosis not present

## 2016-12-07 DIAGNOSIS — Z0000 Encounter for general adult medical examination without abnormal findings: Secondary | ICD-10-CM | POA: Diagnosis not present

## 2016-12-07 LAB — BASIC METABOLIC PANEL
BUN: 13 mg/dL (ref 6–23)
CALCIUM: 9.6 mg/dL (ref 8.4–10.5)
CO2: 33 mEq/L — ABNORMAL HIGH (ref 19–32)
CREATININE: 0.8 mg/dL (ref 0.40–1.20)
Chloride: 101 mEq/L (ref 96–112)
GFR: 82.25 mL/min (ref >60.00)
Glucose, Bld: 87 mg/dL (ref 70–99)
Potassium: 3.6 mEq/L (ref 3.5–5.1)
SODIUM: 139 meq/L (ref 135–145)

## 2016-12-07 LAB — CBC WITH DIFFERENTIAL/PLATELET
BASOS ABS: 0.1 10*3/uL (ref 0.0–0.1)
Basophils Relative: 1 % (ref 0.0–3.0)
EOS PCT: 3.3 % (ref 0.0–5.0)
Eosinophils Absolute: 0.2 10*3/uL (ref 0.0–0.7)
HEMATOCRIT: 39.6 % (ref 36.0–46.0)
Hemoglobin: 13.4 g/dL (ref 12.0–15.0)
LYMPHS ABS: 1.7 10*3/uL (ref 0.7–4.0)
LYMPHS PCT: 30.2 % (ref 12.0–46.0)
MCHC: 33.9 g/dL (ref 30.0–36.0)
MCV: 89.4 fl (ref 78.0–100.0)
Monocytes Absolute: 0.4 10*3/uL (ref 0.1–1.0)
Monocytes Relative: 7.2 % (ref 3.0–12.0)
NEUTROS ABS: 3.2 10*3/uL (ref 1.4–7.7)
NEUTROS PCT: 58.3 % (ref 43.0–77.0)
PLATELETS: 285 10*3/uL (ref 150.0–400.0)
RBC: 4.43 Mil/uL (ref 3.87–5.11)
RDW: 13 % (ref 11.5–15.5)
WBC: 5.6 10*3/uL (ref 4.0–10.5)

## 2016-12-07 LAB — LIPID PANEL
Cholesterol: 197 mg/dL (ref 0–200)
HDL: 60.4 mg/dL (ref >39.00)
LDL Cholesterol: 117 mg/dL — ABNORMAL HIGH (ref 0–99)
NONHDL: 136.75
Total CHOL/HDL Ratio: 3
Triglycerides: 99 mg/dL (ref 0.0–149.0)
VLDL: 19.8 mg/dL (ref 0.0–40.0)

## 2016-12-07 LAB — HEPATIC FUNCTION PANEL
ALT: 11 U/L (ref 0–35)
AST: 14 U/L (ref 0–37)
Albumin: 4.5 g/dL (ref 3.5–5.2)
Alkaline Phosphatase: 56 U/L (ref 39–117)
BILIRUBIN DIRECT: 0.1 mg/dL (ref 0.0–0.3)
TOTAL PROTEIN: 7 g/dL (ref 6.0–8.3)
Total Bilirubin: 0.4 mg/dL (ref 0.2–1.2)

## 2016-12-07 LAB — TSH: TSH: 1.92 u[IU]/mL (ref 0.35–4.50)

## 2016-12-07 NOTE — Patient Instructions (Signed)
Follow up in 1 year or as needed We'll notify you of your lab results and make any changes Keep up the good work on healthy diet and regular exercise- you look great!!! Call with any questions or concerns Welcome!!!  We're glad to have you!!!

## 2016-12-07 NOTE — Progress Notes (Signed)
Pre visit review using our clinic review tool, if applicable. No additional management support is needed unless otherwise documented below in the visit note. 

## 2016-12-07 NOTE — Progress Notes (Signed)
   Subjective:    Patient ID: Alexis ClontsJennifer J Herman, female    DOB: 1970/07/27, 46 y.o.   MRN: [id number]  HPI New to establish.  Previous MD- none recently  GYN- [place]  SVT- pt had ablation  CPE- no concerns today   Review of Systems Patient reports no vision/ hearing changes, adenopathy,fever, weight change,  persistant/recurrent hoarseness , swallowing issues, chest pain, palpitations, edema, persistant/recurrent cough, hemoptysis, dyspnea (rest/exertional/paroxysmal nocturnal), gastrointestinal bleeding (melena, rectal bleeding), abdominal pain, significant heartburn, bowel changes, GU symptoms (dysuria, hematuria, incontinence), Gyn symptoms (abnormal  bleeding, pain),  syncope, focal weakness, memory loss, numbness & tingling, skin/hair/nail changes, abnormal bruising or bleeding, anxiety, or depression.     Objective:   Physical Exam General Appearance:    Alert, cooperative, no distress, appears stated age  Head:    Normocephalic, without obvious abnormality, atraumatic  Eyes:    PERRL, conjunctiva/corneas clear, EOM's intact, fundi    benign, both eyes  Ears:    Normal TM's and external ear canals, both ears  Nose:   Nares normal, septum midline, mucosa normal, no drainage    or sinus tenderness  Throat:   Lips, mucosa, and tongue normal; teeth and gums normal  Neck:   Supple, symmetrical, trachea midline, no adenopathy;    Thyroid: no enlargement/tenderness/nodules  Back:     Symmetric, no curvature, ROM normal, no CVA tenderness  Lungs:     Clear to auscultation bilaterally, respirations unlabored  Chest Wall:    No tenderness or deformity   Heart:    Regular rate and rhythm, S1 and S2 normal, no murmur, rub   or gallop  Breast Exam:    Deferred to GYN  Abdomen:     Soft, non-tender, bowel sounds active all four quadrants,    no masses, no organomegaly  Genitalia:    Deferred to GYN  Rectal:    Extremities:   Extremities normal, atraumatic, no cyanosis or edema    Pulses:   2+ and symmetric all extremities  Skin:   Skin color, texture, turgor normal, no rashes or lesions  Lymph nodes:   Cervical, supraclavicular, and axillary nodes normal  Neurologic:   CNII-XII intact, normal strength, sensation and reflexes    throughout          Assessment & Plan:

## 2016-12-10 ENCOUNTER — Encounter: Payer: Self-pay | Admitting: General Practice

## 2016-12-10 NOTE — Assessment & Plan Note (Signed)
Pt's PE WNL and UTD on GYN.  Tdap given today.  Check labs.  Anticipatory guidance provided.

## 2017-05-22 ENCOUNTER — Encounter: Payer: Self-pay | Admitting: Family Medicine

## 2017-05-29 ENCOUNTER — Other Ambulatory Visit: Payer: Self-pay

## 2017-05-29 ENCOUNTER — Ambulatory Visit (INDEPENDENT_AMBULATORY_CARE_PROVIDER_SITE_OTHER): Payer: BLUE CROSS/BLUE SHIELD | Admitting: Family Medicine

## 2017-05-29 ENCOUNTER — Encounter: Payer: Self-pay | Admitting: Family Medicine

## 2017-05-29 DIAGNOSIS — K219 Gastro-esophageal reflux disease without esophagitis: Secondary | ICD-10-CM | POA: Diagnosis not present

## 2017-05-29 MED ORDER — OMEPRAZOLE 40 MG PO CPDR: 40.0000 mg | DELAYED_RELEASE_CAPSULE | Freq: Every day | ORAL | 3 refills | Status: DC

## 2017-05-29 NOTE — Patient Instructions (Signed)
Follow up as needed or as scheduled Start the Omeprazole 40mg  daily x2-3 weeks and monitor for improvement After 2-3 weeks stop the medication and see if symptoms return You can start and stop the meds as needed Try and keep track of possible triggers (certain foods, drinks) Take tums as needed If symptoms change or worsen, let me know! Hang in there! HAPPY BIRTHDAY!!!

## 2017-05-29 NOTE — Progress Notes (Signed)
   Subjective:    Patient ID: Alexis Herman, female    DOB: 11/30/1970, 47 y.o.   MRN: [id number]  HPI Indigestion- pt reports increased bloating after eating certain foods.  'it feels like i've eaten rocks'.  'a heavy full feeling'.  Area of discomfort is right between lower ribs.  Pt ate a lot of rich food over the holidays and her trip to [place].  Was also having burping.  Prilosec did improve sxs.  Pt has hx of IBS.  Pt is describing the discomfort as a 'gnawing'.  Waves of nausea but no vomiting.  sxs started at end of [place] trip- woke her up at night.  Pt is currently asymptomatic.   Review of Systems For ROS see HPI     Objective:   Physical Exam  Constitutional: She is oriented to person, place, and time. She appears well-developed and well-nourished. No distress.  Abdominal: Soft. Bowel sounds are normal. She exhibits no distension. There is no tenderness. There is no rebound and no guarding.  Neurological: She is alert and oriented to person, place, and time.  Skin: Skin is warm and dry.  Psychiatric: She has a normal mood and affect. Her behavior is normal. Thought content normal.  Vitals reviewed.         Assessment & Plan:

## 2017-05-29 NOTE — Assessment & Plan Note (Signed)
New.  Pt's sxs are consistent w/ GERD- which is re-enforced by the fact that sxs improved w/ OTC Prilosec.  Currently asymptomatic.  Reviewed possible triggers.  Encouraged her to keep a food log.  Start daily Omeprazole x2 weeks and then prn.  Reviewed supportive care and red flags that should prompt return.  Pt expressed understanding and is in agreement w/ plan.

## 2017-07-19 ENCOUNTER — Other Ambulatory Visit: Payer: Self-pay

## 2017-07-19 DIAGNOSIS — Z1231 Encounter for screening mammogram for malignant neoplasm of breast: Secondary | ICD-10-CM

## 2017-08-19 ENCOUNTER — Other Ambulatory Visit: Payer: Self-pay | Admitting: Emergency Medicine

## 2017-08-19 MED ORDER — OMEPRAZOLE 40 MG PO CPDR: 40.0000 mg | DELAYED_RELEASE_CAPSULE | Freq: Every day | ORAL | 0 refills | Status: DC

## 2017-08-26 ENCOUNTER — Ambulatory Visit
Admission: RE | Admit: 2017-08-26 | Discharge: 2017-08-26 | Disposition: A | Discharge status: Home / Self Care | Payer: BLUE CROSS/BLUE SHIELD | Source: Ambulatory Visit | Attending: Obstetrics & Gynecology | Admitting: Obstetrics & Gynecology

## 2017-08-26 DIAGNOSIS — Z1231 Encounter for screening mammogram for malignant neoplasm of breast: Secondary | ICD-10-CM | POA: Diagnosis not present

## 2017-08-27 ENCOUNTER — Other Ambulatory Visit: Payer: Self-pay | Admitting: Obstetrics & Gynecology

## 2017-08-27 DIAGNOSIS — R928 Other abnormal and inconclusive findings on diagnostic imaging of breast: Secondary | ICD-10-CM

## 2017-08-29 ENCOUNTER — Ambulatory Visit
Admission: RE | Admit: 2017-08-29 | Discharge: 2017-08-29 | Disposition: A | Discharge status: Home / Self Care | Payer: BLUE CROSS/BLUE SHIELD | Source: Ambulatory Visit | Attending: Obstetrics & Gynecology | Admitting: Obstetrics & Gynecology

## 2017-08-29 DIAGNOSIS — R928 Other abnormal and inconclusive findings on diagnostic imaging of breast: Secondary | ICD-10-CM

## 2017-08-29 DIAGNOSIS — R922 Inconclusive mammogram: Secondary | ICD-10-CM | POA: Diagnosis not present

## 2017-08-29 DIAGNOSIS — N6011 Diffuse cystic mastopathy of right breast: Secondary | ICD-10-CM | POA: Diagnosis not present

## 2017-09-03 ENCOUNTER — Other Ambulatory Visit: Payer: Self-pay | Admitting: General Practice

## 2017-09-03 MED ORDER — OMEPRAZOLE 40 MG PO CPDR: 40.0000 mg | DELAYED_RELEASE_CAPSULE | Freq: Every day | ORAL | 0 refills | Status: DC

## 2017-10-02 DIAGNOSIS — Z6826 Body mass index (BMI) 26.0-26.9, adult: Secondary | ICD-10-CM | POA: Diagnosis not present

## 2017-10-02 DIAGNOSIS — Z01419 Encounter for gynecological examination (general) (routine) without abnormal findings: Secondary | ICD-10-CM | POA: Diagnosis not present

## 2017-11-21 ENCOUNTER — Other Ambulatory Visit: Payer: Self-pay | Admitting: Family Medicine

## 2018-01-20 ENCOUNTER — Encounter: Payer: Self-pay | Admitting: Family Medicine

## 2018-05-07 ENCOUNTER — Ambulatory Visit (INDEPENDENT_AMBULATORY_CARE_PROVIDER_SITE_OTHER): Payer: BLUE CROSS/BLUE SHIELD | Admitting: Cardiology

## 2018-05-07 ENCOUNTER — Encounter: Payer: Self-pay | Admitting: Cardiology

## 2018-05-07 VITALS — BP 102/70 | HR 77 | Ht 64.0 in | Wt 162.0 lb

## 2018-05-07 DIAGNOSIS — I471 Supraventricular tachycardia: Secondary | ICD-10-CM

## 2018-05-07 DIAGNOSIS — Z8679 Personal history of other diseases of the circulatory system: Secondary | ICD-10-CM | POA: Diagnosis not present

## 2018-05-07 DIAGNOSIS — Z9889 Other specified postprocedural states: Secondary | ICD-10-CM | POA: Diagnosis not present

## 2018-05-07 NOTE — Progress Notes (Signed)
Cardiology Office Note:    Date:  05/07/2018   ID:  Alexis Herman, DOB Mar 07, 1971, MRN [id number]  PCP:  Sheliah Hatch, MD  Cardiologist:  Donato Schultz, MD  Electrophysiologist:  None   Referring MD: Sheliah Hatch, MD     History of Present Illness:    Alexis Herman is a 48 y.o. female here for follow-up of PSVT.  She saw Dr. Ladona Herman, successful slow pathway ablation performed for AVNRT without complication.  07/04/2016.  Overall she has been doing very well since her ablation, no prolonged sensations of tachycardia.  She has however had a couple episodes of palpitations especially when she is at rest, driving for instance or laying down at night.  They feel like a catch, makes her taking weird breath she states.  Feels like a hesitancy.  This is been happening maybe since December.  She wanted to make sure that nothing was wrong.  Sometimes when she stands up she can feel this.  This is unlike her tachycardia.   Past Medical History:  Diagnosis Date  . Irritable bowel syndrome (IBS)    diet controlled  . Tachycardia    occasional    Past Surgical History:  Procedure Laterality Date  . BILATERAL SALPINGECTOMY Bilateral 04/04/2015   Procedure: BILATERAL SALPINGECTOMY;  Surgeon: Alexis Baars, MD;  Location: [facility];  Service: Gynecology;  Laterality: Bilateral;  . CESAREAN SECTION  2004   x 1 twins  . LAPAROSCOPIC ASSISTED VAGINAL HYSTERECTOMY N/A 04/04/2015   Procedure: LAPAROSCOPIC ASSISTED VAGINAL HYSTERECTOMY;  Surgeon: Alexis Baars, MD;  Location: [facility];  Service: Gynecology;  Laterality: N/A;  . LAPAROSCOPY N/A 06/01/2014   Procedure: LAPAROSCOPY OPERATIVE;  Surgeon: Alexis Hart, MD;  Location: [facility];  Service: Gynecology;  Laterality: N/A;  . OOPHORECTOMY Left 04/04/2015   Procedure: OOPHORECTOMY;  Surgeon: Alexis Baars, MD;  Location: [facility];  Service: Gynecology;  Laterality: Left;  . SVT ABLATION N/A 07/04/2016   Procedure: SVT Ablation;  Surgeon: Alexis Maw, MD;  Location: [facility];  Service: Cardiovascular;  Laterality: N/A;  . TONSILLECTOMY    . WISDOM TOOTH EXTRACTION      Current Medications: Current Meds  Medication Sig  . fluticasone (FLONASE) 50 MCG/ACT nasal spray Place 1 spray into both nostrils at bedtime.  . Multiple Vitamin (MULTIVITAMIN WITH MINERALS) TABS tablet Take 1 tablet by mouth daily.  Marland Kitchen omeprazole (PRILOSEC) 40 MG capsule TAKE 1 CAPSULE DAILY (Patient taking differently: Take 40 mg by mouth as needed. )     Allergies:   Patient has no known allergies.   Social History   Socioeconomic History  . Marital status: Married    Spouse name: Not on file  . Number of children: Not on file  . Years of education: Not on file  . Highest education level: Not on file  Occupational History  . Not on file  Social Needs  . Financial resource strain: Not on file  . Food insecurity:    Worry: Not on file    Inability: Not on file  . Transportation needs:    Medical: Not on file    Non-medical: Not on file  Tobacco Use  . Smoking status: Never Smoker  . Smokeless tobacco: Never Used  Substance and Sexual Activity  . Alcohol use: Yes    Comment: occasional  . Drug use: No  . Sexual activity: Yes    Birth control/protection: None  Lifestyle  . Physical activity:  Days per week: Not on file    Minutes per session: Not on file  . Stress: Not on file  Relationships  . Social connections:    Talks on phone: Not on file    Gets together: Not on file    Attends religious service: Not on file    Active member of club or organization: Not on file    Attends meetings of clubs or organizations: Not on file    Relationship status: Not on file  Other Topics Concern  . Not on file  Social History Narrative  . Not on file     Family History: The patient's family history includes Breast cancer (age of onset: 5965) in her maternal aunt; COPD in her father; Cancer in her mother; Luiz BlareGraves' disease in her  mother.  ROS:   Please see the history of present illness.    Skipped beats irregular.  Occasional sensation of tightness.  All other systems reviewed and are negative.  EKGs/Labs/Other Studies Reviewed:    The following studies were reviewed today: Prior office notes EKG echocardiogram  Echo 2011-55%  EKG:  EKG is  ordered today.  The ekg ordered today demonstrates sinus rhythm 77 borderline low voltage no other abnormalities noted.  Recent Labs: No results found for requested labs within last 8760 hours.  Recent Lipid Panel    Component Value Date/Time   CHOL 197 12/07/2016 1419   TRIG 99.0 12/07/2016 1419   HDL 60.40 12/07/2016 1419   CHOLHDL 3 12/07/2016 1419   VLDL 19.8 12/07/2016 1419   LDLCALC 117 (H) 12/07/2016 1419    Physical Exam:    VS:  BP 102/70   Pulse 77   Ht 5\' 4"  (1.626 m)   Wt 162 lb (73.5 kg)   LMP  (LMP Unknown) Comment: Dec 2016  BMI 27.81 kg/m     Wt Readings from Last 3 Encounters:  05/07/18 162 lb (73.5 kg)  05/29/17 157 lb 8 oz (71.4 kg)  12/07/16 152 lb 6 oz (69.1 kg)     GEN:  Well nourished, well developed in no acute distress HEENT: Normal NECK: No JVD; No carotid bruits LYMPHATICS: No lymphadenopathy CARDIAC: RRR, no murmurs, rubs, gallops RESPIRATORY:  Clear to auscultation without rales, wheezing or rhonchi  ABDOMEN: Soft, non-tender, non-distended MUSCULOSKELETAL:  No edema; No deformity  SKIN: Warm and dry NEUROLOGIC:  Alert and oriented x 3 PSYCHIATRIC:  Normal affect   ASSESSMENT:    1. PSVT (paroxysmal supraventricular tachycardia) (HCC)   2. SVT (supraventricular tachycardia) (HCC)   3. S/P radiofrequency ablation operation for arrhythmia    PLAN:    In order of problems listed above:  PSVT - Post ablation 2018, Dr. Ladona Ridgelaylor.  Doing well.  Understands to use caffeine, alcohol moderation.  Palpitations - Most likely PACs or PVCs, benign.  Explained that the PACs were what likely triggered her tachycardia  previously.  Continue with conservative management.  She will let us know if these worsen.  She is going to have a physical exam by Dr. Beverely Lowabori soon.  With some of her palpitations as well as trouble sleeping at times, she suggested that we check a thyroid panel.  This seems reasonable.  Her mother had hyperthyroidism.  Medication Adjustments/Labs and Tests Ordered: Current medicines are reviewed at length with the patient today.  Concerns regarding medicines are outlined above.  Orders Placed This Encounter  Procedures  . EKG 12-Lead   No orders of the defined types were placed in this  encounter.   Patient Instructions  Medication Instructions:  Your physician recommends that you continue on your current medications as directed. Please refer to the Current Medication list given to you today.  Labwork: None ordered.  Testing/Procedures: None ordered.  Follow-Up: Your physician recommends that you schedule a follow-up appointment as needed with Dr Anne Fu  Any Other Special Instructions Will Be Listed Below (If Applicable).     If you need a refill on your cardiac medications before your next appointment, please call your pharmacy.     Signed, Donato Schultz, MD  05/07/2018 3:36 PM    [facility]

## 2018-05-07 NOTE — Patient Instructions (Signed)
Medication Instructions:  Your physician recommends that you continue on your current medications as directed. Please refer to the Current Medication list given to you today.  Labwork: None ordered.  Testing/Procedures: None ordered.  Follow-Up: Your physician recommends that you schedule a follow-up appointment as needed with Dr Skains  Any Other Special Instructions Will Be Listed Below (If Applicable).     If you need a refill on your cardiac medications before your next appointment, please call your pharmacy.  

## 2018-05-21 ENCOUNTER — Other Ambulatory Visit: Payer: Self-pay

## 2018-05-21 ENCOUNTER — Encounter: Payer: Self-pay | Admitting: Family Medicine

## 2018-05-21 ENCOUNTER — Ambulatory Visit (INDEPENDENT_AMBULATORY_CARE_PROVIDER_SITE_OTHER): Payer: BLUE CROSS/BLUE SHIELD | Admitting: Family Medicine

## 2018-05-21 VITALS — BP 104/72 | HR 78 | Temp 98.2°F | Resp 18 | Ht 64.0 in | Wt 161.2 lb

## 2018-05-21 DIAGNOSIS — G4700 Insomnia, unspecified: Secondary | ICD-10-CM

## 2018-05-21 DIAGNOSIS — N951 Menopausal and female climacteric states: Secondary | ICD-10-CM | POA: Diagnosis not present

## 2018-05-21 DIAGNOSIS — R454 Irritability and anger: Secondary | ICD-10-CM

## 2018-05-21 DIAGNOSIS — G479 Sleep disorder, unspecified: Secondary | ICD-10-CM | POA: Diagnosis not present

## 2018-05-21 DIAGNOSIS — R5383 Other fatigue: Secondary | ICD-10-CM | POA: Diagnosis not present

## 2018-05-21 LAB — CBC WITH DIFFERENTIAL/PLATELET
BASOS ABS: 0 10*3/uL (ref 0.0–0.1)
BASOS PCT: 0.9 % (ref 0.0–3.0)
EOS PCT: 3.5 % (ref 0.0–5.0)
Eosinophils Absolute: 0.2 10*3/uL (ref 0.0–0.7)
HCT: 40.4 % (ref 36.0–46.0)
Hemoglobin: 13.6 g/dL (ref 12.0–15.0)
LYMPHS ABS: 1.4 10*3/uL (ref 0.7–4.0)
Lymphocytes Relative: 30.1 % (ref 12.0–46.0)
MCHC: 33.7 g/dL (ref 30.0–36.0)
MCV: 88.8 fl (ref 78.0–100.0)
MONO ABS: 0.4 10*3/uL (ref 0.1–1.0)
Monocytes Relative: 8.1 % (ref 3.0–12.0)
NEUTROS PCT: 57.4 % (ref 43.0–77.0)
Neutro Abs: 2.7 10*3/uL (ref 1.4–7.7)
Platelets: 248 10*3/uL (ref 150.0–400.0)
RBC: 4.55 Mil/uL (ref 3.87–5.11)
RDW: 12.9 % (ref 11.5–15.5)
WBC: 4.7 10*3/uL (ref 4.0–10.5)

## 2018-05-21 LAB — BASIC METABOLIC PANEL
BUN: 11 mg/dL (ref 6–23)
CALCIUM: 9.4 mg/dL (ref 8.4–10.5)
CO2: 29 mEq/L (ref 19–32)
Chloride: 100 mEq/L (ref 96–112)
Creatinine, Ser: 0.78 mg/dL (ref 0.40–1.20)
GFR: 79.17 mL/min (ref >60.00)
Glucose, Bld: 95 mg/dL (ref 70–99)
Potassium: 3.9 mEq/L (ref 3.5–5.1)
SODIUM: 135 meq/L (ref 135–145)

## 2018-05-21 LAB — FOLLICLE STIMULATING HORMONE: FSH: 5.6 m[IU]/mL

## 2018-05-21 LAB — TSH: TSH: 1.72 u[IU]/mL (ref 0.35–4.50)

## 2018-05-21 LAB — LUTEINIZING HORMONE: LH: 4.83 m[IU]/mL

## 2018-05-21 LAB — T4, FREE: FREE T4: 0.85 ng/dL (ref 0.60–1.60)

## 2018-05-21 LAB — T3, FREE: T3 FREE: 3.4 pg/mL (ref 2.3–4.2)

## 2018-05-21 NOTE — Patient Instructions (Signed)
We'll notify you of your lab results and make any changes if needed Continue the Melatonin nightly- ~1 hr prior to your desired bed time Keep up the good work on healthy diet and regular exercise- you can do it! Call with any questions or concerns Happy Early Birthday!!!

## 2018-05-21 NOTE — Progress Notes (Signed)
   Subjective:    Patient ID: Alexis Herman, female    DOB: 03/18/1971, 48 y.o.   MRN: [id number]  HPI ? Thyroid issue- 'I just haven't felt like myself'.  Mom had thyroid issues and recognized many of pt's sxs.  Pt reports she is able to fall asleep but has issues w/ staying asleep.  Increased irritability.  Pt is up 5 lbs despite eating better and exercising.  Pt has 1 remaining ovary.  Denies feelings of sadness or increased stress.  Denies anxiety.  Pt recently started Melatonin.     Review of Systems For ROS see HPI     Objective:   Physical Exam Vitals signs reviewed.  Constitutional:      General: She is not in acute distress.    Appearance: She is well-developed.  HENT:     Head: Normocephalic and atraumatic.  Eyes:     Conjunctiva/sclera: Conjunctivae normal.     Pupils: Pupils are equal, round, and reactive to light.  Neck:     Musculoskeletal: Normal range of motion and neck supple.     Thyroid: No thyromegaly.  Cardiovascular:     Rate and Rhythm: Normal rate and regular rhythm.     Heart sounds: Normal heart sounds. No murmur.  Pulmonary:     Effort: Pulmonary effort is normal. No respiratory distress.     Breath sounds: Normal breath sounds.  Abdominal:     General: There is no distension.     Palpations: Abdomen is soft.     Tenderness: There is no abdominal tenderness.  Lymphadenopathy:     Cervical: No cervical adenopathy.  Skin:    General: Skin is warm and dry.  Neurological:     Mental Status: She is alert and oriented to person, place, and time.  Psychiatric:        Behavior: Behavior normal.           Assessment & Plan:  Fatigue- new to provider, fairly recent for pt.  Mom w/ hx of thyroid disorder.  Check labs to r/o metabolic cause and tx any abnormalities if present.  Irritability- new.  Pt reports she is not overly stressed at the present but does admit to poor sleep which is likely contributing.  Discussed possibility of  peri-menopause.  Will follow.  Insomnia- new.  Pt w/ difficulty staying asleep.  Started Melatonin last month.  Discussed proper use.  Also discussed that this could be associated w/ perimenopause.  Check FSH and LH as pt is no longer having periods.  Perimenopause- new.  Suspect that all of the above sxs are due to perimenopause.  She does not have periods so she cannot use this as a guide.  Will check labs to assess.  Discussed OTC remedies such as Estroven.  Will follow.

## 2018-05-22 ENCOUNTER — Encounter: Payer: Self-pay | Admitting: General Practice

## 2018-06-16 DIAGNOSIS — M47812 Spondylosis without myelopathy or radiculopathy, cervical region: Secondary | ICD-10-CM | POA: Diagnosis not present

## 2018-06-16 DIAGNOSIS — M542 Cervicalgia: Secondary | ICD-10-CM | POA: Diagnosis not present

## 2018-06-16 DIAGNOSIS — M791 Myalgia, unspecified site: Secondary | ICD-10-CM | POA: Diagnosis not present

## 2018-06-16 DIAGNOSIS — M50222 Other cervical disc displacement at C5-C6 level: Secondary | ICD-10-CM | POA: Diagnosis not present

## 2018-06-16 DIAGNOSIS — M50322 Other cervical disc degeneration at C5-C6 level: Secondary | ICD-10-CM | POA: Diagnosis not present

## 2018-06-23 DIAGNOSIS — M542 Cervicalgia: Secondary | ICD-10-CM | POA: Diagnosis not present

## 2018-06-23 DIAGNOSIS — M50322 Other cervical disc degeneration at C5-C6 level: Secondary | ICD-10-CM | POA: Diagnosis not present

## 2018-06-23 DIAGNOSIS — M50222 Other cervical disc displacement at C5-C6 level: Secondary | ICD-10-CM | POA: Diagnosis not present

## 2018-06-23 DIAGNOSIS — M791 Myalgia, unspecified site: Secondary | ICD-10-CM | POA: Diagnosis not present

## 2018-06-23 DIAGNOSIS — M47812 Spondylosis without myelopathy or radiculopathy, cervical region: Secondary | ICD-10-CM | POA: Diagnosis not present

## 2018-06-30 DIAGNOSIS — M50222 Other cervical disc displacement at C5-C6 level: Secondary | ICD-10-CM | POA: Diagnosis not present

## 2018-06-30 DIAGNOSIS — M542 Cervicalgia: Secondary | ICD-10-CM | POA: Diagnosis not present

## 2018-06-30 DIAGNOSIS — M47812 Spondylosis without myelopathy or radiculopathy, cervical region: Secondary | ICD-10-CM | POA: Diagnosis not present

## 2018-06-30 DIAGNOSIS — M791 Myalgia, unspecified site: Secondary | ICD-10-CM | POA: Diagnosis not present

## 2018-06-30 DIAGNOSIS — M50322 Other cervical disc degeneration at C5-C6 level: Secondary | ICD-10-CM | POA: Diagnosis not present

## 2018-07-01 ENCOUNTER — Ambulatory Visit: Payer: BLUE CROSS/BLUE SHIELD | Admitting: Cardiology

## 2018-07-07 DIAGNOSIS — M47812 Spondylosis without myelopathy or radiculopathy, cervical region: Secondary | ICD-10-CM | POA: Diagnosis not present

## 2018-07-07 DIAGNOSIS — M50322 Other cervical disc degeneration at C5-C6 level: Secondary | ICD-10-CM | POA: Diagnosis not present

## 2018-07-07 DIAGNOSIS — M791 Myalgia, unspecified site: Secondary | ICD-10-CM | POA: Diagnosis not present

## 2018-07-07 DIAGNOSIS — M50222 Other cervical disc displacement at C5-C6 level: Secondary | ICD-10-CM | POA: Diagnosis not present

## 2018-07-07 DIAGNOSIS — M542 Cervicalgia: Secondary | ICD-10-CM | POA: Diagnosis not present

## 2018-07-21 DIAGNOSIS — M791 Myalgia, unspecified site: Secondary | ICD-10-CM | POA: Diagnosis not present

## 2018-07-21 DIAGNOSIS — M50322 Other cervical disc degeneration at C5-C6 level: Secondary | ICD-10-CM | POA: Diagnosis not present

## 2018-07-21 DIAGNOSIS — M47812 Spondylosis without myelopathy or radiculopathy, cervical region: Secondary | ICD-10-CM | POA: Diagnosis not present

## 2018-07-21 DIAGNOSIS — M545 Low back pain: Secondary | ICD-10-CM | POA: Diagnosis not present

## 2018-07-21 DIAGNOSIS — M50222 Other cervical disc displacement at C5-C6 level: Secondary | ICD-10-CM | POA: Diagnosis not present

## 2018-07-24 DIAGNOSIS — M50322 Other cervical disc degeneration at C5-C6 level: Secondary | ICD-10-CM | POA: Diagnosis not present

## 2018-07-24 DIAGNOSIS — M50222 Other cervical disc displacement at C5-C6 level: Secondary | ICD-10-CM | POA: Diagnosis not present

## 2018-07-24 DIAGNOSIS — M545 Low back pain: Secondary | ICD-10-CM | POA: Diagnosis not present

## 2018-07-24 DIAGNOSIS — M791 Myalgia, unspecified site: Secondary | ICD-10-CM | POA: Diagnosis not present

## 2018-07-24 DIAGNOSIS — M47812 Spondylosis without myelopathy or radiculopathy, cervical region: Secondary | ICD-10-CM | POA: Diagnosis not present

## 2018-08-11 DIAGNOSIS — M50322 Other cervical disc degeneration at C5-C6 level: Secondary | ICD-10-CM | POA: Diagnosis not present

## 2018-08-11 DIAGNOSIS — M545 Low back pain: Secondary | ICD-10-CM | POA: Diagnosis not present

## 2018-08-11 DIAGNOSIS — M50223 Other cervical disc displacement at C6-C7 level: Secondary | ICD-10-CM | POA: Diagnosis not present

## 2018-08-11 DIAGNOSIS — M791 Myalgia, unspecified site: Secondary | ICD-10-CM | POA: Diagnosis not present

## 2018-08-11 DIAGNOSIS — M5127 Other intervertebral disc displacement, lumbosacral region: Secondary | ICD-10-CM | POA: Diagnosis not present

## 2018-08-14 DIAGNOSIS — M791 Myalgia, unspecified site: Secondary | ICD-10-CM | POA: Diagnosis not present

## 2018-08-14 DIAGNOSIS — M5127 Other intervertebral disc displacement, lumbosacral region: Secondary | ICD-10-CM | POA: Diagnosis not present

## 2018-08-14 DIAGNOSIS — M50322 Other cervical disc degeneration at C5-C6 level: Secondary | ICD-10-CM | POA: Diagnosis not present

## 2018-08-14 DIAGNOSIS — M50223 Other cervical disc displacement at C6-C7 level: Secondary | ICD-10-CM | POA: Diagnosis not present

## 2018-08-14 DIAGNOSIS — M545 Low back pain: Secondary | ICD-10-CM | POA: Diagnosis not present

## 2018-08-18 DIAGNOSIS — M50322 Other cervical disc degeneration at C5-C6 level: Secondary | ICD-10-CM | POA: Diagnosis not present

## 2018-08-18 DIAGNOSIS — M5127 Other intervertebral disc displacement, lumbosacral region: Secondary | ICD-10-CM | POA: Diagnosis not present

## 2018-08-18 DIAGNOSIS — M791 Myalgia, unspecified site: Secondary | ICD-10-CM | POA: Diagnosis not present

## 2018-08-18 DIAGNOSIS — M50223 Other cervical disc displacement at C6-C7 level: Secondary | ICD-10-CM | POA: Diagnosis not present

## 2018-08-25 DIAGNOSIS — M50223 Other cervical disc displacement at C6-C7 level: Secondary | ICD-10-CM | POA: Diagnosis not present

## 2018-08-25 DIAGNOSIS — M791 Myalgia, unspecified site: Secondary | ICD-10-CM | POA: Diagnosis not present

## 2018-08-25 DIAGNOSIS — M5127 Other intervertebral disc displacement, lumbosacral region: Secondary | ICD-10-CM | POA: Diagnosis not present

## 2018-08-25 DIAGNOSIS — M50322 Other cervical disc degeneration at C5-C6 level: Secondary | ICD-10-CM | POA: Diagnosis not present

## 2018-09-08 ENCOUNTER — Other Ambulatory Visit: Payer: Self-pay | Admitting: Obstetrics

## 2018-09-08 DIAGNOSIS — Z1231 Encounter for screening mammogram for malignant neoplasm of breast: Secondary | ICD-10-CM

## 2018-09-10 DIAGNOSIS — M50322 Other cervical disc degeneration at C5-C6 level: Secondary | ICD-10-CM | POA: Diagnosis not present

## 2018-09-10 DIAGNOSIS — M50223 Other cervical disc displacement at C6-C7 level: Secondary | ICD-10-CM | POA: Diagnosis not present

## 2018-09-10 DIAGNOSIS — M791 Myalgia, unspecified site: Secondary | ICD-10-CM | POA: Diagnosis not present

## 2018-09-10 DIAGNOSIS — M5127 Other intervertebral disc displacement, lumbosacral region: Secondary | ICD-10-CM | POA: Diagnosis not present

## 2018-09-17 DIAGNOSIS — M50322 Other cervical disc degeneration at C5-C6 level: Secondary | ICD-10-CM | POA: Diagnosis not present

## 2018-09-17 DIAGNOSIS — M5127 Other intervertebral disc displacement, lumbosacral region: Secondary | ICD-10-CM | POA: Diagnosis not present

## 2018-09-17 DIAGNOSIS — M791 Myalgia, unspecified site: Secondary | ICD-10-CM | POA: Diagnosis not present

## 2018-09-17 DIAGNOSIS — M50223 Other cervical disc displacement at C6-C7 level: Secondary | ICD-10-CM | POA: Diagnosis not present

## 2018-10-29 ENCOUNTER — Ambulatory Visit
Admission: RE | Admit: 2018-10-29 | Discharge: 2018-10-29 | Disposition: A | Discharge status: Home / Self Care | Payer: BLUE CROSS/BLUE SHIELD | Source: Ambulatory Visit | Attending: Obstetrics | Admitting: Obstetrics

## 2018-10-29 ENCOUNTER — Other Ambulatory Visit: Payer: Self-pay

## 2018-10-29 DIAGNOSIS — Z1231 Encounter for screening mammogram for malignant neoplasm of breast: Secondary | ICD-10-CM

## 2018-10-30 ENCOUNTER — Other Ambulatory Visit: Payer: Self-pay | Admitting: Gastroenterology

## 2018-10-30 DIAGNOSIS — R109 Unspecified abdominal pain: Secondary | ICD-10-CM

## 2018-10-30 DIAGNOSIS — R1010 Upper abdominal pain, unspecified: Secondary | ICD-10-CM | POA: Diagnosis not present

## 2018-10-30 DIAGNOSIS — K589 Irritable bowel syndrome without diarrhea: Secondary | ICD-10-CM | POA: Diagnosis not present

## 2018-11-06 DIAGNOSIS — Z6827 Body mass index (BMI) 27.0-27.9, adult: Secondary | ICD-10-CM | POA: Diagnosis not present

## 2018-11-06 DIAGNOSIS — Z01419 Encounter for gynecological examination (general) (routine) without abnormal findings: Secondary | ICD-10-CM | POA: Diagnosis not present

## 2018-11-12 ENCOUNTER — Ambulatory Visit
Admission: RE | Admit: 2018-11-12 | Discharge: 2018-11-12 | Disposition: A | Discharge status: Home / Self Care | Payer: BC Managed Care – PPO | Source: Ambulatory Visit | Attending: Gastroenterology | Admitting: Gastroenterology

## 2018-11-12 DIAGNOSIS — R109 Unspecified abdominal pain: Secondary | ICD-10-CM

## 2018-11-12 DIAGNOSIS — R1013 Epigastric pain: Secondary | ICD-10-CM | POA: Diagnosis not present

## 2018-11-28 DIAGNOSIS — Z8371 Family history of colonic polyps: Secondary | ICD-10-CM | POA: Diagnosis not present

## 2018-11-28 LAB — HM COLONOSCOPY

## 2018-12-23 ENCOUNTER — Encounter: Payer: Self-pay | Admitting: General Practice

## 2019-05-31 ENCOUNTER — Emergency Department (HOSPITAL_BASED_OUTPATIENT_CLINIC_OR_DEPARTMENT_OTHER): Payer: BC Managed Care – PPO

## 2019-05-31 ENCOUNTER — Other Ambulatory Visit: Payer: Self-pay

## 2019-05-31 ENCOUNTER — Encounter (HOSPITAL_BASED_OUTPATIENT_CLINIC_OR_DEPARTMENT_OTHER): Payer: Self-pay | Admitting: Emergency Medicine

## 2019-05-31 ENCOUNTER — Emergency Department (HOSPITAL_BASED_OUTPATIENT_CLINIC_OR_DEPARTMENT_OTHER)
Admission: EM | Admit: 2019-05-31 | Discharge: 2019-05-31 | Disposition: A | Discharge status: Home / Self Care | Payer: BC Managed Care – PPO | Attending: Emergency Medicine | Admitting: Emergency Medicine

## 2019-05-31 DIAGNOSIS — M79605 Pain in left leg: Secondary | ICD-10-CM | POA: Insufficient documentation

## 2019-05-31 DIAGNOSIS — Z79899 Other long term (current) drug therapy: Secondary | ICD-10-CM | POA: Diagnosis not present

## 2019-05-31 DIAGNOSIS — R200 Anesthesia of skin: Secondary | ICD-10-CM | POA: Diagnosis not present

## 2019-05-31 DIAGNOSIS — M79662 Pain in left lower leg: Secondary | ICD-10-CM

## 2019-05-31 DIAGNOSIS — R202 Paresthesia of skin: Secondary | ICD-10-CM | POA: Diagnosis not present

## 2019-05-31 DIAGNOSIS — S8992XA Unspecified injury of left lower leg, initial encounter: Secondary | ICD-10-CM | POA: Diagnosis not present

## 2019-05-31 MED ORDER — METHOCARBAMOL 500 MG PO TABS: 500.0000 mg | ORAL_TABLET | Freq: Two times a day (BID) | ORAL | 0 refills | Status: DC

## 2019-05-31 MED ORDER — NAPROXEN 500 MG PO TABS: 500.0000 mg | ORAL_TABLET | Freq: Two times a day (BID) | ORAL | 0 refills | Status: DC

## 2019-05-31 MED ORDER — ACETAMINOPHEN 325 MG PO TABS
650.0000 mg | ORAL_TABLET | Freq: Once | ORAL | Status: AC
  Administered 2019-05-31: 650 mg via ORAL
  Filled 2019-05-31: qty 2

## 2019-05-31 NOTE — ED Provider Notes (Signed)
[facility] EMERGENCY DEPARTMENT Provider Note   CSN: [id number] Arrival date & time: 05/31/19  1544     History Chief Complaint  Patient presents with  . Leg Pain    Alexis Herman is a 49 y.o. female with a past medical history significant for IBS, GERD, endometriosis, and PSVT who presents to the ED due to sudden onset of severe left calf pain that started after running up stairs 2 hours prior to arrival. Patient notes as she was running up the stairs she heard a "snap" in her left calf which caused her to instantly fall. Patient denies head injury and LOC. She notes she was able to catch her fall with her hands. Patient denies falling on her hip. Left calf pain is associated with left foot numbness/tingling. Patient denies associative edema, erythema, and warmth. Pain is worse with movement and weightbearing. She notes her pain is a 3/10 at rest and intensifies with movement. Patient denies history of blood clots, recent surgeries, recent long immobilizations, and hormonal treatments. Patient denies chest pain and shortness of breath. She took 2 Advils prior to arrival with moderate relief. Patient denies recent antibiotic usage.     Past Medical History:  Diagnosis Date  . Irritable bowel syndrome (IBS)    diet controlled  . Tachycardia    occasional    Patient Active Problem List   Diagnosis Date Noted  . GERD (gastroesophageal reflux disease) 05/29/2017  . Physical exam 12/07/2016  . Endometriosis 04/04/2015  . PSVT (paroxysmal supraventricular tachycardia) (HCC) 02/18/2015  . SVT (supraventricular tachycardia) (HCC) 08/01/2009    Past Surgical History:  Procedure Laterality Date  . BILATERAL SALPINGECTOMY Bilateral 04/04/2015   Procedure: BILATERAL SALPINGECTOMY;  Surgeon: Marlow Baars, MD;  Location: [facility];  Service: Gynecology;  Laterality: Bilateral;  . CESAREAN SECTION  2004   x 1 twins  . LAPAROSCOPIC ASSISTED VAGINAL HYSTERECTOMY N/A 04/04/2015   Procedure: LAPAROSCOPIC ASSISTED VAGINAL HYSTERECTOMY;  Surgeon: Marlow Baars, MD;  Location: [facility];  Service: Gynecology;  Laterality: N/A;  . LAPAROSCOPY N/A 06/01/2014   Procedure: LAPAROSCOPY OPERATIVE;  Surgeon: Essie Hart, MD;  Location: [facility];  Service: Gynecology;  Laterality: N/A;  . OOPHORECTOMY Left 04/04/2015   Procedure: OOPHORECTOMY;  Surgeon: Marlow Baars, MD;  Location: [facility];  Service: Gynecology;  Laterality: Left;  . SVT ABLATION N/A 07/04/2016   Procedure: SVT Ablation;  Surgeon: Marinus Maw, MD;  Location: [facility] CV LAB;  Service: Cardiovascular;  Laterality: N/A;  . TONSILLECTOMY    . WISDOM TOOTH EXTRACTION       OB History   No obstetric history on file.     Family History  Problem Relation Age of Onset  . Cancer Mother        breast  . Luiz Blare' disease Mother   . COPD Father   . Breast cancer Maternal Aunt 76    Social History   Tobacco Use  . Smoking status: Never Smoker  . Smokeless tobacco: Never Used  Substance Use Topics  . Alcohol use: Yes    Comment: occasional  . Drug use: No    Home Medications Prior to Admission medications   Medication Sig Start Date End Date Taking? Authorizing Provider  fluticasone (FLONASE) 50 MCG/ACT nasal spray Place 1 spray into both nostrils at bedtime.    [provider]  methocarbamol (ROBAXIN) 500 MG tablet Take 1 tablet (500 mg total) by mouth 2 (two) times daily. 05/31/19   Mannie Stabile, PA-C  Multiple Vitamin (MULTIVITAMIN WITH MINERALS) TABS tablet Take 1 tablet by mouth daily.    [provider]  naproxen (NAPROSYN) 500 MG tablet Take 1 tablet (500 mg total) by mouth 2 (two) times daily. 05/31/19   Suzy Bouchard, PA-C  omeprazole (PRILOSEC) 40 MG capsule TAKE 1 CAPSULE DAILY Patient taking differently: Take 40 mg by mouth as needed.  11/21/17   Midge Minium, MD    Allergies    Patient has no known allergies.  Review of Systems   Review of Systems   Constitutional: Negative for chills and fever.  Respiratory: Negative for shortness of breath.   Cardiovascular: Negative for chest pain.  Musculoskeletal: Positive for gait problem and myalgias (left calf). Negative for back pain and neck pain.  Skin: Negative for color change and wound.  Neurological: Positive for numbness (left foot).  All other systems reviewed and are negative.   Physical Exam Updated Vital Signs BP 130/86 (BP Location: Right Arm)   Pulse 74   Temp 98.3 F (36.8 C) (Oral)   Resp 16   Ht 5\' 4"  (1.626 m)   Wt 48.4 kg   LMP  (LMP Unknown) Comment: Dec 2016  SpO2 100%   BMI 18.33 kg/m   Physical Exam Vitals and nursing note reviewed.  Constitutional:      General: She is not in acute distress.    Appearance: She is not ill-appearing.  HENT:     Head: Normocephalic.  Eyes:     Conjunctiva/sclera: Conjunctivae normal.  Cardiovascular:     Rate and Rhythm: Normal rate and regular rhythm.     Pulses: Normal pulses.     Heart sounds: Normal heart sounds. No murmur. No friction rub. No gallop.   Pulmonary:     Effort: Pulmonary effort is normal.     Breath sounds: Normal breath sounds.  Abdominal:     General: Abdomen is flat. There is no distension.     Palpations: Abdomen is soft.     Tenderness: There is no abdominal tenderness. There is no guarding or rebound.  Musculoskeletal:     Cervical back: Neck supple.     Right lower leg: No edema.     Left lower leg: No edema.     Comments: Tenderness to palpation over left calf. No erythema, edema, or warmth. Distal pulses and sensation intact. No bony tenderness at hip, knee, or foot. Full ROM of hip, knee, and ankle.   Skin:    General: Skin is warm and dry.  Neurological:     General: No focal deficit present.     Mental Status: She is alert.  Psychiatric:        Mood and Affect: Mood normal.        Behavior: Behavior normal.     ED Results / Procedures / Treatments   Labs (all labs ordered  are listed, but only abnormal results are displayed) Labs Reviewed - No data to display  EKG None  Radiology DG Tibia/Fibula Left  Result Date: 05/31/2019 CLINICAL DATA:  Left leg injury and pain while walking up stairs today. Initial encounter. EXAM: LEFT TIBIA AND FIBULA - 2 VIEW COMPARISON:  None. FINDINGS: There is no evidence of fracture or other focal bone lesions. Soft tissues are unremarkable. IMPRESSION: Negative. Electronically Signed   By: Marlaine Hind M.D.   On: 05/31/2019 17:02    Procedures Procedures (including critical care time)  Medications Ordered in ED Medications  acetaminophen (TYLENOL) tablet 650 mg (  650 mg Oral Given 05/31/19 1700)    ED Course  I have reviewed the triage vital signs and the nursing notes.  Pertinent labs & imaging results that were available during my care of the patient were reviewed by me and considered in my medical decision making (see chart for details).    MDM Rules/Calculators/A&P                     49 year old female presents to the ED due to sudden onset of left calf pain that started as she was running up the stairs. Patient admits to hearing a snap. Denies recent antibiotic usage. No risks factors for blood clots. Vitals all within normal limits. Patient not tachycardic or hypoxic. Patient in no acute distress and non-ill appearing. Tenderness to palpation over left calf with no erythema, edema, or warmth. Neurovascularly intact. Full ROM of left hip, knee, and ankle. Suspect muscle strain vs. Muscle rupture. Will obtain x-ray to rule out stress fracture. PERC negative and low risk using wells criteria. Doubt PE/DVT.  X-ray personally reviewed which is negative for bony fractures. Suspect musculature component. ACE wrap placed over left calf here in the ED. Patient discharged with naproxen and Robaxin. Instructed patient that robaxin can cause drowsiness and to not drive or operate machinery while on the medication. Sport medicine  number given to patient at discharge. Advised patient to call and schedule an appointment if symptoms do not improve within the next week. RICE discussed with patient. Strict ED precautions discussed with patient. Patient states understanding and agrees to plan. Patient discharged home in no acute distress and stable vitals.  Final Clinical Impression(s) / ED Diagnoses Final diagnoses:  Pain of left calf    Rx / DC Orders ED Discharge Orders         Ordered    naproxen (NAPROSYN) 500 MG tablet  2 times daily     05/31/19 1717    methocarbamol (ROBAXIN) 500 MG tablet  2 times daily     05/31/19 1717           Jesusita Oka 05/31/19 1720    Eber Hong, MD 05/31/19 [phone]

## 2019-05-31 NOTE — Discharge Instructions (Signed)
As discussed, your x-ray was normal today. I suspect your pulled a muscle. I am sending you home with naproxen and Robaxin. Take as prescribed. Robaxin is a muscle relaxer and can cause drowsiness, so do not drive or operate machinery while on the medication. Ice and elevate leg. I have included the number of the sports medicine doctor. Call to schedule an appointment if symptoms do not improve within the next week. Return to the ER for new or worsening symptoms.

## 2019-05-31 NOTE — ED Provider Notes (Signed)
This patient is a pleasant 49 year old female presenting after having a acute pain in her left medial calf which occurred last night while she was climbing stairs.  She felt a sudden pop and release of tension in her calf followed by difficulty with ambulation secondary to pain when she tries to plantarflex or when she tries to stretch her calf muscle out.  On exam she has some reproducible tenderness in the medial belly of those muscles however she has no obvious swelling bruising and there is no defect in her Achilles tendon which works perfectly.  She is able to fully range her ankle.  Reproducible tenderness suggest a muscular tear, she is well-appearing, recommend Ace wrap, anti-inflammatories, ice, elevation AKA rice therapy and physical therapy or sports medicine follow-up, the patient is agreeable to the plan.  I personally viewed the images at the bedside on the x-ray machine, no signs of fractures or dislocations  Medical screening examination/treatment/procedure(s) were conducted as a shared visit with non-physician practitioner(s) and myself.  I personally evaluated the patient during the encounter.  Clinical Impression:   Final diagnoses:  Pain of left calf         Eber Hong, MD 05/31/19 [phone]

## 2019-05-31 NOTE — ED Triage Notes (Addendum)
Was walking up the stair about 2 hrs ago and began to left calf pain and feels l like something snapped to the area. Pain at rest, worse with movement. Denies long car ride but took a plane trip x 2 weeks ago

## 2019-07-26 ENCOUNTER — Other Ambulatory Visit: Payer: Self-pay | Admitting: Family Medicine

## 2019-08-11 ENCOUNTER — Encounter: Payer: Self-pay | Admitting: Family Medicine

## 2019-08-11 MED ORDER — OMEPRAZOLE 40 MG PO CPDR: 40.0000 mg | DELAYED_RELEASE_CAPSULE | Freq: Every day | ORAL | 1 refills | Status: DC

## 2020-01-30 ENCOUNTER — Other Ambulatory Visit: Payer: Self-pay | Admitting: Family Medicine

## 2020-06-16 ENCOUNTER — Other Ambulatory Visit: Payer: Self-pay | Admitting: Obstetrics

## 2020-06-16 DIAGNOSIS — Z1231 Encounter for screening mammogram for malignant neoplasm of breast: Secondary | ICD-10-CM

## 2020-08-04 ENCOUNTER — Ambulatory Visit
Admission: RE | Admit: 2020-08-04 | Discharge: 2020-08-04 | Disposition: A | Discharge status: Home / Self Care | Payer: BC Managed Care – PPO | Source: Ambulatory Visit | Attending: Obstetrics | Admitting: Obstetrics

## 2020-08-04 ENCOUNTER — Other Ambulatory Visit: Payer: Self-pay

## 2020-08-04 DIAGNOSIS — Z1231 Encounter for screening mammogram for malignant neoplasm of breast: Secondary | ICD-10-CM

## 2020-08-06 ENCOUNTER — Encounter: Payer: Self-pay | Admitting: Family Medicine

## 2020-08-06 DIAGNOSIS — N6019 Diffuse cystic mastopathy of unspecified breast: Secondary | ICD-10-CM

## 2020-08-08 ENCOUNTER — Other Ambulatory Visit: Payer: Self-pay | Admitting: Obstetrics

## 2020-08-08 DIAGNOSIS — N63 Unspecified lump in unspecified breast: Secondary | ICD-10-CM

## 2020-08-08 NOTE — Telephone Encounter (Signed)
Pt asking if she should have you or OBGYN order diagnostic mammo and [place] due to dense breast tissue. Please advise

## 2020-08-09 ENCOUNTER — Other Ambulatory Visit: Payer: Self-pay | Admitting: Family Medicine

## 2020-08-09 DIAGNOSIS — N63 Unspecified lump in unspecified breast: Secondary | ICD-10-CM

## 2020-09-14 ENCOUNTER — Other Ambulatory Visit: Payer: Self-pay

## 2020-09-14 ENCOUNTER — Ambulatory Visit
Admission: RE | Admit: 2020-09-14 | Discharge: 2020-09-14 | Disposition: A | Discharge status: Home / Self Care | Payer: BC Managed Care – PPO | Source: Ambulatory Visit | Attending: Obstetrics | Admitting: Obstetrics

## 2020-09-14 DIAGNOSIS — N63 Unspecified lump in unspecified breast: Secondary | ICD-10-CM

## 2020-09-14 LAB — HM MAMMOGRAPHY

## 2020-09-28 HISTORY — PX: SHOULDER SURGERY: SHX246

## 2020-10-26 ENCOUNTER — Encounter: Payer: Self-pay | Admitting: *Deleted

## 2020-12-02 ENCOUNTER — Encounter: Payer: Self-pay | Admitting: Family Medicine

## 2021-01-03 ENCOUNTER — Ambulatory Visit (INDEPENDENT_AMBULATORY_CARE_PROVIDER_SITE_OTHER): Payer: BC Managed Care – PPO | Admitting: Registered Nurse

## 2021-01-03 ENCOUNTER — Encounter: Payer: Self-pay | Admitting: Registered Nurse

## 2021-01-03 ENCOUNTER — Other Ambulatory Visit: Payer: Self-pay

## 2021-01-03 VITALS — BP 112/70 | HR 80 | Temp 98.2°F | Resp 16 | Ht 64.0 in | Wt 156.8 lb

## 2021-01-03 DIAGNOSIS — R059 Cough, unspecified: Secondary | ICD-10-CM | POA: Diagnosis not present

## 2021-01-03 DIAGNOSIS — R0600 Dyspnea, unspecified: Secondary | ICD-10-CM

## 2021-01-03 DIAGNOSIS — K219 Gastro-esophageal reflux disease without esophagitis: Secondary | ICD-10-CM | POA: Diagnosis not present

## 2021-01-03 DIAGNOSIS — R0609 Other forms of dyspnea: Secondary | ICD-10-CM

## 2021-01-03 LAB — CBC WITH DIFFERENTIAL/PLATELET
Basophils Absolute: 0.1 10*3/uL (ref 0.0–0.1)
Basophils Relative: 1.4 % (ref 0.0–3.0)
Eosinophils Absolute: 0.3 10*3/uL (ref 0.0–0.7)
Eosinophils Relative: 7.2 % — ABNORMAL HIGH (ref 0.0–5.0)
HCT: 40.2 % (ref 36.0–46.0)
Hemoglobin: 13.2 g/dL (ref 12.0–15.0)
Lymphocytes Relative: 24.8 % (ref 12.0–46.0)
Lymphs Abs: 1.1 10*3/uL (ref 0.7–4.0)
MCHC: 32.8 g/dL (ref 30.0–36.0)
MCV: 89.9 fl (ref 78.0–100.0)
Monocytes Absolute: 0.3 10*3/uL (ref 0.1–1.0)
Monocytes Relative: 7.2 % (ref 3.0–12.0)
Neutro Abs: 2.6 10*3/uL (ref 1.4–7.7)
Neutrophils Relative %: 59.4 % (ref 43.0–77.0)
Platelets: 272 10*3/uL (ref 150.0–400.0)
RBC: 4.47 Mil/uL (ref 3.87–5.11)
RDW: 12.6 % (ref 11.5–15.5)
WBC: 4.3 10*3/uL (ref 4.0–10.5)

## 2021-01-03 LAB — COMPREHENSIVE METABOLIC PANEL
ALT: 15 U/L (ref 0–35)
AST: 16 U/L (ref 0–37)
Albumin: 3.8 g/dL (ref 3.5–5.2)
Alkaline Phosphatase: 54 U/L (ref 39–117)
BUN: 12 mg/dL (ref 6–23)
CO2: 30 mEq/L (ref 19–32)
Calcium: 9 mg/dL (ref 8.4–10.5)
Chloride: 102 mEq/L (ref 96–112)
Creatinine, Ser: 0.76 mg/dL (ref 0.40–1.20)
GFR: 91.9 mL/min (ref >60.00)
Glucose, Bld: 94 mg/dL (ref 70–99)
Potassium: 4 mEq/L (ref 3.5–5.1)
Sodium: 138 mEq/L (ref 135–145)
Total Bilirubin: 0.4 mg/dL (ref 0.2–1.2)
Total Protein: 6.2 g/dL (ref 6.0–8.3)

## 2021-01-03 LAB — H. PYLORI ANTIBODY, IGG: H Pylori IgG: NEGATIVE

## 2021-01-03 MED ORDER — RABEPRAZOLE SODIUM 20 MG PO TBEC: 20.0000 mg | DELAYED_RELEASE_TABLET | Freq: Every day | ORAL | 3 refills | Status: DC

## 2021-01-03 MED ORDER — FAMOTIDINE 20 MG PO TABS: 20.0000 mg | ORAL_TABLET | Freq: Two times a day (BID) | ORAL | 0 refills | Status: DC

## 2021-01-03 MED ORDER — SUCRALFATE 1 GM/10ML PO SUSP: 1.0000 g | Freq: Three times a day (TID) | ORAL | 0 refills | Status: DC

## 2021-01-03 NOTE — Telephone Encounter (Signed)
Ok to fill  Thanks,   Retail banker

## 2021-01-03 NOTE — Progress Notes (Signed)
Established Patient Office Visit  Subjective:  Patient ID: Alexis Herman, female    DOB: 27-Dec-1970  Age: 50 y.o. MRN: [id number]  CC:  Chief Complaint  Patient presents with   Gastroesophageal Reflux    Patient states she has burning sensation in the sternum area. Has been taking acid reflux medicine but has not been helping.    Cough    Cough, congestion, shortness of breath on exertion, feels like she can't catch her breath after laughing.     HPI Alexis Herman presents for GERD and cough  Shoulder surgery in early June - had been taking NSAIDs daily since January - stopped taking since onset of these symptoms.   Had COVID in June. Home treatment. Doing generally well since  Having burning sensation in middle of chest/sternum. Some nausea. Has tried OTC GERD treatment - omeprazole - without effect. Had teledoc appt, added pepcid. Has been on both for about 1 mo. Improved but not resolved. "Burning, gnawing pain" Hx of SVT, s/p ablation, no issues since.   Cough: coming with exertion, notes DOE on exertion, after laughing, etc. Some production with cough. Feels like it's originiating from chest rather than PND. No LOC or lightheadedness.    Past Medical History:  Diagnosis Date   Irritable bowel syndrome (IBS)    diet controlled   Tachycardia    occasional    Past Surgical History:  Procedure Laterality Date   BILATERAL SALPINGECTOMY Bilateral 04/04/2015   Procedure: BILATERAL SALPINGECTOMY;  Surgeon: Marlow Baars, MD;  Location: [facility];  Service: Gynecology;  Laterality: Bilateral;   CESAREAN SECTION  2004   x 1 twins   LAPAROSCOPIC ASSISTED VAGINAL HYSTERECTOMY N/A 04/04/2015   Procedure: LAPAROSCOPIC ASSISTED VAGINAL HYSTERECTOMY;  Surgeon: Marlow Baars, MD;  Location: [facility];  Service: Gynecology;  Laterality: N/A;   LAPAROSCOPY N/A 06/01/2014   Procedure: LAPAROSCOPY OPERATIVE;  Surgeon: Essie Hart, MD;  Location: [facility];  Service: Gynecology;  Laterality:  N/A;   OOPHORECTOMY Left 04/04/2015   Procedure: OOPHORECTOMY;  Surgeon: Marlow Baars, MD;  Location: [facility];  Service: Gynecology;  Laterality: Left;   SVT ABLATION N/A 07/04/2016   Procedure: SVT Ablation;  Surgeon: Marinus Maw, MD;  Location: [facility] INVASIVE CV LAB;  Service: Cardiovascular;  Laterality: N/A;   TONSILLECTOMY     WISDOM TOOTH EXTRACTION      Family History  Problem Relation Age of Onset   Graves' disease Mother    Breast cancer Mother 59   COPD Father    Breast cancer Maternal Aunt 59    Social History   Socioeconomic History   Marital status: Married    Spouse name: Not on file   Number of children: Not on file   Years of education: Not on file   Highest education level: Not on file  Occupational History   Not on file  Tobacco Use   Smoking status: Never   Smokeless tobacco: Never  Vaping Use   Vaping Use: Never used  Substance and Sexual Activity   Alcohol use: Yes    Comment: occasional   Drug use: No   Sexual activity: Yes    Birth control/protection: None  Other Topics Concern   Not on file  Social History Narrative   Not on file   Social Determinants of Health   Financial Resource Strain: Not on file  Food Insecurity: Not on file  Transportation Needs: Not on file  Physical Activity: Not on file  Stress: Not  on file  Social Connections: Not on file  Intimate Partner Violence: Not on file    Outpatient Medications Prior to Visit  Medication Sig Dispense Refill   estradiol (VIVELLE-DOT) 0.025 MG/24HR Place 1 patch onto the skin 2 (two) times a week.     famotidine (PEPCID) 20 MG tablet Take 20 mg by mouth 2 (two) times daily.     fluticasone (FLONASE) 50 MCG/ACT nasal spray Place 1 spray into both nostrils at bedtime.     omeprazole (PRILOSEC) 40 MG capsule Take 1 capsule (40 mg total) by mouth daily. 90 capsule 1   methocarbamol (ROBAXIN) 500 MG tablet Take 1 tablet (500 mg total) by mouth 2 (two) times daily. (Patient not taking:  Reported on 01/03/2021) 20 tablet 0   Multiple Vitamin (MULTIVITAMIN WITH MINERALS) TABS tablet Take 1 tablet by mouth daily. (Patient not taking: Reported on 01/03/2021)     naproxen (NAPROSYN) 500 MG tablet Take 1 tablet (500 mg total) by mouth 2 (two) times daily. (Patient not taking: Reported on 01/03/2021) 30 tablet 0   No facility-administered medications prior to visit.    No Known Allergies  ROS Review of Systems  Constitutional: Negative.   HENT: Negative.    Eyes: Negative.   Respiratory:  Positive for cough, chest tightness and shortness of breath.   Cardiovascular:  Positive for chest pain.  Gastrointestinal: Negative.   Endocrine: Negative.   Genitourinary: Negative.   Musculoskeletal: Negative.   Skin: Negative.   Allergic/Immunologic: Negative.   Neurological: Negative.   Hematological: Negative.   Psychiatric/Behavioral: Negative.    All other systems reviewed and are negative.    Objective:    Physical Exam Vitals and nursing note reviewed.  Constitutional:      General: She is not in acute distress.    Appearance: Normal appearance. She is normal weight. She is not ill-appearing, toxic-appearing or diaphoretic.  Cardiovascular:     Rate and Rhythm: Normal rate and regular rhythm.     Heart sounds: Normal heart sounds. No murmur heard.   No friction rub. No gallop.  Pulmonary:     Effort: Pulmonary effort is normal. No respiratory distress.     Breath sounds: Normal breath sounds. No stridor. No wheezing, rhonchi or rales.  Chest:     Chest wall: No tenderness.  Skin:    General: Skin is warm and dry.  Neurological:     General: No focal deficit present.     Mental Status: She is alert and oriented to person, place, and time. Mental status is at baseline.  Psychiatric:        Mood and Affect: Mood normal.        Behavior: Behavior normal.        Thought Content: Thought content normal.        Judgment: Judgment normal.    BP 112/70   Pulse 80    Temp 98.2 F (36.8 C) (Temporal)   Resp 16   Ht 5\' 4"  (1.626 m)   Wt 156 lb 12.8 oz (71.1 kg)   LMP  (LMP Unknown) Comment: Dec 2016  SpO2 99%   BMI 26.91 kg/m  Wt Readings from Last 3 Encounters:  01/03/21 156 lb 12.8 oz (71.1 kg)  05/31/19 106 lb 12.8 oz (48.4 kg)  05/21/18 161 lb 4 oz (73.1 kg)     There are no preventive care reminders to display for this patient.  There are no preventive care reminders to display for this patient.  Lab Results  Component Value Date   TSH 1.72 05/21/2018   Lab Results  Component Value Date   WBC 4.7 05/21/2018   HGB 13.6 05/21/2018   HCT 40.4 05/21/2018   MCV 88.8 05/21/2018   PLT 248.0 05/21/2018   Lab Results  Component Value Date   NA 135 05/21/2018   K 3.9 05/21/2018   CO2 29 05/21/2018   GLUCOSE 95 05/21/2018   BUN 11 05/21/2018   CREATININE 0.78 05/21/2018   BILITOT 0.4 12/07/2016   ALKPHOS 56 12/07/2016   AST 14 12/07/2016   ALT 11 12/07/2016   PROT 7.0 12/07/2016   ALBUMIN 4.5 12/07/2016   CALCIUM 9.4 05/21/2018   ANIONGAP 5 04/05/2015   GFR 79.17 05/21/2018   Lab Results  Component Value Date   CHOL 197 12/07/2016   Lab Results  Component Value Date   HDL 60.40 12/07/2016   Lab Results  Component Value Date   LDLCALC 117 (H) 12/07/2016   Lab Results  Component Value Date   TRIG 99.0 12/07/2016   Lab Results  Component Value Date   CHOLHDL 3 12/07/2016   No results found for: HGBA1C    Assessment & Plan:   Problem List Items Addressed This Visit       Digestive   GERD (gastroesophageal reflux disease)   Relevant Medications   famotidine (PEPCID) 20 MG tablet   RABEprazole (ACIPHEX) 20 MG tablet   sucralfate (CARAFATE) 1 GM/10ML suspension   Other Relevant Orders   H. pylori antibody, IgG   CBC with Differential/Platelet   Comprehensive metabolic panel   D-Dimer, Quantitative   Other Visit Diagnoses     DOE (dyspnea on exertion)    -  Primary   Relevant Orders   EKG 12-Lead  (Completed)   CBC with Differential/Platelet   Comprehensive metabolic panel   D-Dimer, Quantitative   Cough       Relevant Medications   RABEprazole (ACIPHEX) 20 MG tablet   Other Relevant Orders   H. pylori antibody, IgG   CBC with Differential/Platelet   Comprehensive metabolic panel   D-Dimer, Quantitative       Meds ordered this encounter  Medications   RABEprazole (ACIPHEX) 20 MG tablet    Sig: Take 1 tablet (20 mg total) by mouth daily.    Dispense:  30 tablet    Refill:  3    Order Specific Question:   Supervising Provider    Answer:   Neva Seat, JEFFREY R [[id number]]   sucralfate (CARAFATE) 1 GM/10ML suspension    Sig: Take 10 mLs (1 g total) by mouth 4 (four) times daily -  with meals and at bedtime.    Dispense:  420 mL    Refill:  0    Order Specific Question:   Supervising Provider    Answer:   Neva Seat, JEFFREY R [[id number]]    Follow-up: Return if symptoms worsen or fail to improve.   PLAN EKG today compared to 05/07/2018. EKG at that time shows low voltage borderline without further abnormalities. Today's EKG shows no acute changes. Borderline low voltage as previously. RRR. Will switch to rabeprazole as more potent PPI. Continue famotidine. Add sucralfate qid for ten days. H pylori serology sent, will follow up as warranted. Reviewed ER precautions with patient who voices understanding CBC, BMP, and d-dimer ordered. Will follow up as warranted.  Order cxr if symptoms don't improve or if they worsen by end of week.  Patient encouraged to call clinic with any  questions, comments, or concerns.  Maximiano Coss, NP

## 2021-01-03 NOTE — Patient Instructions (Addendum)
Ms. Soliday -   Randie Heinz to meet you  In brief:  Stop omeprazole. Start Rabeprazole in it's place. 20mg  daily. More potent than omeprazole - cheaper at [facility] - have printed rx coupon.  Continue famotidine Start sucralfate four times daily - before meals and before bed EKG nearly identical to last one taken by Dr. [facility] today. No acute concerns.  Labs should be back today - maybe tomorrow at latest. I'll call with any acute concerns. Xray ordered - if no improvement in symptoms, or if things get worse, pop in to [facility] in [place] on Friday or next Monday to check for pneumonia.  Call if symptoms progress or worsen  Thank you  Rich

## 2021-01-04 LAB — D-DIMER, QUANTITATIVE: D-Dimer, Quant: 0.25 mcg/mL FEU (ref <0.50)

## 2021-01-09 ENCOUNTER — Encounter: Payer: Self-pay | Admitting: Registered Nurse

## 2021-01-23 ENCOUNTER — Other Ambulatory Visit: Payer: Self-pay | Admitting: Registered Nurse

## 2021-01-23 DIAGNOSIS — R0609 Other forms of dyspnea: Secondary | ICD-10-CM

## 2021-01-23 DIAGNOSIS — R059 Cough, unspecified: Secondary | ICD-10-CM

## 2021-01-23 DIAGNOSIS — R0600 Dyspnea, unspecified: Secondary | ICD-10-CM

## 2021-01-23 NOTE — Telephone Encounter (Signed)
[facility] called in stated that patient was there for a chest X-ray but there were no orders in epic for them to do X-ray please place order if able  Please advise patient when she can return to get X-rays

## 2021-01-24 ENCOUNTER — Other Ambulatory Visit: Payer: Self-pay

## 2021-01-25 ENCOUNTER — Ambulatory Visit
Admission: RE | Admit: 2021-01-25 | Discharge: 2021-01-25 | Disposition: A | Discharge status: Home / Self Care | Payer: BC Managed Care – PPO | Source: Ambulatory Visit | Attending: Registered Nurse | Admitting: Registered Nurse

## 2021-01-25 DIAGNOSIS — R0600 Dyspnea, unspecified: Secondary | ICD-10-CM

## 2021-01-25 DIAGNOSIS — R0609 Other forms of dyspnea: Secondary | ICD-10-CM

## 2021-01-25 DIAGNOSIS — R059 Cough, unspecified: Secondary | ICD-10-CM

## 2021-01-26 ENCOUNTER — Encounter: Payer: Self-pay | Admitting: Registered Nurse

## 2021-02-17 ENCOUNTER — Other Ambulatory Visit: Payer: Self-pay | Admitting: Registered Nurse

## 2021-02-17 DIAGNOSIS — R053 Chronic cough: Secondary | ICD-10-CM

## 2021-02-17 DIAGNOSIS — R0609 Other forms of dyspnea: Secondary | ICD-10-CM

## 2021-02-23 ENCOUNTER — Other Ambulatory Visit: Payer: Self-pay

## 2021-02-23 ENCOUNTER — Encounter: Payer: Self-pay | Admitting: Family Medicine

## 2021-02-23 ENCOUNTER — Ambulatory Visit (INDEPENDENT_AMBULATORY_CARE_PROVIDER_SITE_OTHER): Payer: BC Managed Care – PPO | Admitting: Family Medicine

## 2021-02-23 VITALS — BP 90/68 | HR 70 | Temp 98.3°F | Resp 17 | Ht 64.0 in | Wt 153.6 lb

## 2021-02-23 DIAGNOSIS — R053 Chronic cough: Secondary | ICD-10-CM | POA: Diagnosis not present

## 2021-02-23 DIAGNOSIS — E663 Overweight: Secondary | ICD-10-CM

## 2021-02-23 DIAGNOSIS — Z0000 Encounter for general adult medical examination without abnormal findings: Secondary | ICD-10-CM | POA: Diagnosis not present

## 2021-02-23 DIAGNOSIS — R0602 Shortness of breath: Secondary | ICD-10-CM

## 2021-02-23 LAB — LIPID PANEL
Cholesterol: 204 mg/dL — ABNORMAL HIGH (ref 0–200)
HDL: 59.4 mg/dL (ref >39.00)
LDL Cholesterol: 125 mg/dL — ABNORMAL HIGH (ref 0–99)
NonHDL: 144.26
Total CHOL/HDL Ratio: 3
Triglycerides: 94 mg/dL (ref 0.0–149.0)
VLDL: 18.8 mg/dL (ref 0.0–40.0)

## 2021-02-23 LAB — CBC WITH DIFFERENTIAL/PLATELET
Basophils Absolute: 0.1 10*3/uL (ref 0.0–0.1)
Basophils Relative: 1 % (ref 0.0–3.0)
Eosinophils Absolute: 0.6 10*3/uL (ref 0.0–0.7)
Eosinophils Relative: 9.4 % — ABNORMAL HIGH (ref 0.0–5.0)
HCT: 39.4 % (ref 36.0–46.0)
Hemoglobin: 13.3 g/dL (ref 12.0–15.0)
Lymphocytes Relative: 24.5 % (ref 12.0–46.0)
Lymphs Abs: 1.6 10*3/uL (ref 0.7–4.0)
MCHC: 33.8 g/dL (ref 30.0–36.0)
MCV: 87.7 fl (ref 78.0–100.0)
Monocytes Absolute: 0.4 10*3/uL (ref 0.1–1.0)
Monocytes Relative: 6.9 % (ref 3.0–12.0)
Neutro Abs: 3.7 10*3/uL (ref 1.4–7.7)
Neutrophils Relative %: 58.2 % (ref 43.0–77.0)
Platelets: 272 10*3/uL (ref 150.0–400.0)
RBC: 4.49 Mil/uL (ref 3.87–5.11)
RDW: 12.8 % (ref 11.5–15.5)
WBC: 6.4 10*3/uL (ref 4.0–10.5)

## 2021-02-23 LAB — HEPATIC FUNCTION PANEL
ALT: 13 U/L (ref 0–35)
AST: 19 U/L (ref 0–37)
Albumin: 4.1 g/dL (ref 3.5–5.2)
Alkaline Phosphatase: 56 U/L (ref 39–117)
Bilirubin, Direct: 0 mg/dL (ref 0.0–0.3)
Total Bilirubin: 0.3 mg/dL (ref 0.2–1.2)
Total Protein: 6.4 g/dL (ref 6.0–8.3)

## 2021-02-23 LAB — BASIC METABOLIC PANEL
BUN: 11 mg/dL (ref 6–23)
CO2: 30 mEq/L (ref 19–32)
Calcium: 9 mg/dL (ref 8.4–10.5)
Chloride: 101 mEq/L (ref 96–112)
Creatinine, Ser: 0.8 mg/dL (ref 0.40–1.20)
GFR: 86.33 mL/min (ref >60.00)
Glucose, Bld: 86 mg/dL (ref 70–99)
Potassium: 3.6 mEq/L (ref 3.5–5.1)
Sodium: 138 mEq/L (ref 135–145)

## 2021-02-23 LAB — TSH: TSH: 1.49 u[IU]/mL (ref 0.35–5.50)

## 2021-02-23 MED ORDER — ALBUTEROL SULFATE HFA 108 (90 BASE) MCG/ACT IN AERS: 2.0000 | INHALATION_SPRAY | Freq: Four times a day (QID) | RESPIRATORY_TRACT | 0 refills | Status: DC | PRN

## 2021-02-23 MED ORDER — BECLOMETHASONE DIPROP HFA 80 MCG/ACT IN AERB: 1.0000 | INHALATION_SPRAY | Freq: Two times a day (BID) | RESPIRATORY_TRACT | 3 refills | Status: DC

## 2021-02-23 NOTE — Patient Instructions (Addendum)
Follow up in 1 year or as needed We'll notify you of your lab results and make any changes if needed Continue to work on healthy diet and regular exercise- you're doing great! Start the Beclomethasone 1 puff twice daily for the next 2 weeks Use the Albuterol as needed- 2 puffs every 4-6 hrs as needed for cough/shortness of breath Call with any questions or concerns Stay Safe!  Stay Healthy! Happy Halloween!!

## 2021-02-23 NOTE — Assessment & Plan Note (Signed)
Pt's PE WNL.  UTD on mammo, Tdap.  Due for flu- will get at work.  Check labs.  Anticipatory guidance provided.

## 2021-02-23 NOTE — Progress Notes (Signed)
Subjective:    Patient ID: Alexis Herman, female    DOB: 02/29/1971, 50 y.o.   MRN: [id number]  HPI CPE- no need for pap (due to hysterectomy), UTD on mammogram, UTD on Tdap  Patient Care Team    Relationship Specialty Notifications Start End  Sheliah Hatch, MD PCP - General Family Medicine  12/07/16   Jake Bathe, MD PCP - Cardiology Cardiology  05/07/18   Marinus Maw, MD Consulting Physician Cardiology  12/07/16   Marlow Baars, MD Consulting Physician Obstetrics  12/07/16     Health Maintenance  Topic Date Due   COVID-19 Vaccine (4 - Booster for Pfizer series) 05/06/2020   INFLUENZA VACCINE  07/28/2021 (Originally 11/28/2020)   Hepatitis C Screening  01/03/2022 (Originally 05/30/1989)   HIV Screening  01/03/2022 (Originally 05/30/1986)   MAMMOGRAM  09/14/2021   COLONOSCOPY (Pts 45-38yrs Insurance coverage will need to be confirmed)  11/28/2023   TETANUS/TDAP  12/08/2026   Pneumococcal Vaccine 5-27 Years old  Aged Out   HPV VACCINES  Aged Out      Review of Systems Patient reports no vision/ hearing changes, adenopathy,fever, weight change,  persistant/recurrent hoarseness , swallowing issues, chest pain, palpitations, edema, hemoptysis, gastrointestinal bleeding (melena, rectal bleeding), abdominal pain, bowel changes, GU symptoms (dysuria, hematuria, incontinence), Gyn symptoms (abnormal  bleeding, pain),  syncope, focal weakness, memory loss, numbness & tingling, skin/hair/nail changes, abnormal bruising or bleeding, anxiety, or depression.   + GERD- improving since last visit w/ Rich.  Sxs started in July and were severe at that time.  Now on Omeprazole + cough- occurs w/ exercise, laughing.  Some associated SOB.  Feels she has difficulty getting air out rather than breathing in.  Has not been on any inhalers.  Has pulmonary appt upcoming.  This visit occurred during the SARS-CoV-2 public health emergency.  Safety protocols were in place, including screening  questions prior to the visit, additional usage of staff PPE, and extensive cleaning of exam room while observing appropriate contact time as indicated for disinfecting solutions.      Objective:   Physical Exam General Appearance:    Alert, cooperative, no distress, appears stated age  Head:    Normocephalic, without obvious abnormality, atraumatic  Eyes:    PERRL, conjunctiva/corneas clear, EOM's intact, fundi    benign, both eyes  Ears:    Normal TM's and external ear canals, both ears  Nose:   Deferred due to COVID  Throat:    Neck:   Supple, symmetrical, trachea midline, no adenopathy;    Thyroid: no enlargement/tenderness/nodules  Back:     Symmetric, no curvature, ROM normal, no CVA tenderness  Lungs:     Clear to auscultation bilaterally, respirations unlabored, near constant cough  Chest Wall:    No tenderness or deformity   Heart:    Regular rate and rhythm, S1 and S2 normal, no murmur, rub   or gallop  Breast Exam:    Deferred to mammo  Abdomen:     Soft, non-tender, bowel sounds active all four quadrants,    no masses, no organomegaly  Genitalia:    Deferred to GYN  Rectal:    Extremities:   Extremities normal, atraumatic, no cyanosis or edema  Pulses:   2+ and symmetric all extremities  Skin:   Skin color, texture, turgor normal, no rashes or lesions  Lymph nodes:   Cervical, supraclavicular, and axillary nodes normal  Neurologic:   CNII-XII intact, normal strength, sensation and reflexes  throughout          Assessment & Plan:  Chronic cough/SOB- new.  Pt had severe GERD and also COVID in July.  Has had cough and SOB since.  Currently on PPI w/ some relief.  Continues to have cough and SOB w/ minimal exertion.  Will start ICS and albuterol prior to her upcoming pulmonary appt to see if this provides relief of airway inflammation.  Pt expressed understanding and is in agreement w/ plan.

## 2021-02-24 ENCOUNTER — Encounter: Payer: Self-pay | Admitting: Family Medicine

## 2021-03-01 ENCOUNTER — Other Ambulatory Visit: Payer: Self-pay | Admitting: Gastroenterology

## 2021-03-01 DIAGNOSIS — R112 Nausea with vomiting, unspecified: Secondary | ICD-10-CM

## 2021-03-01 DIAGNOSIS — R109 Unspecified abdominal pain: Secondary | ICD-10-CM

## 2021-03-10 ENCOUNTER — Other Ambulatory Visit: Payer: Self-pay

## 2021-03-10 ENCOUNTER — Encounter: Payer: Self-pay | Admitting: Pulmonary Disease

## 2021-03-10 ENCOUNTER — Ambulatory Visit (INDEPENDENT_AMBULATORY_CARE_PROVIDER_SITE_OTHER): Payer: BC Managed Care – PPO | Admitting: Pulmonary Disease

## 2021-03-10 VITALS — BP 112/72 | HR 76 | Ht 64.0 in | Wt 154.8 lb

## 2021-03-10 DIAGNOSIS — R0982 Postnasal drip: Secondary | ICD-10-CM | POA: Diagnosis not present

## 2021-03-10 DIAGNOSIS — J302 Other seasonal allergic rhinitis: Secondary | ICD-10-CM

## 2021-03-10 DIAGNOSIS — J45998 Other asthma: Secondary | ICD-10-CM | POA: Diagnosis not present

## 2021-03-10 MED ORDER — MONTELUKAST SODIUM 10 MG PO TABS: 10.0000 mg | ORAL_TABLET | Freq: Every day | ORAL | 11 refills | Status: DC

## 2021-03-10 MED ORDER — IPRATROPIUM BROMIDE 0.03 % NA SOLN: 1.0000 | Freq: Two times a day (BID) | NASAL | 12 refills | Status: DC | PRN

## 2021-03-10 MED ORDER — FLUTICASONE FUROATE-VILANTEROL 200-25 MCG/ACT IN AEPB: 1.0000 | INHALATION_SPRAY | Freq: Every day | RESPIRATORY_TRACT | 6 refills | Status: DC

## 2021-03-10 NOTE — Patient Instructions (Signed)
You likely have developed postviral reactive airways disease which is leading to your cough, chest tightness and shortness of breath.  This condition is similar to asthma.  Start Breo Ellipta 1 puff daily -Rinse mouth out after each use  Use albuterol inhaler 1 to 2 puffs every 4-6 hours for cough, chest tightness, shortness of breath or wheezing  Start montelukast 10 mg daily for allergies  Use ipratropium nasal spray 1 to 2 sprays twice daily as needed for sinus congestion and postnasal drainage.

## 2021-03-10 NOTE — Progress Notes (Signed)
Synopsis: Referred in November 2022 for Chronic Cough by Kerrin Mo, NP   Subjective:   PATIENT ID: Alexis Herman GENDER: female DOB: July 13, 1970, MRN: [id number]  HPI  Chief Complaint  Patient presents with   Consult    Referred by PCP for chronic cough that started after having Covid in July 2022. Increased SOB with exertion.    Alexis Herman is a 50 year old woman, never smoker who is referred to pulmonary clinic for chronic cough.   She reports having cough and chest tightness along with shortness of breath with exertion since having COVID in July 2022.  She has been treated with PPI for suspected GERD after prolonged NSAID use after a shoulder surgery with improvement of her reflux symptoms but no change in her cough or chest tightness.  She was provided with Qvar and albuterol rescue inhaler a couple weeks ago.  She has not noticed much difference since starting the Qvar but has noticed some relief when using the albuterol inhaler.  The cough does wake her from sleep at night.  She does report seasonal allergies that affect her nose and sinuses more so in the spring and fall seasons.  She does have sinus congestion and postnasal drainage.  She is using Flonase nasal spray 1 spray per nostril daily.  She takes an over-the-counter Zyrtec as needed for her allergies.  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  She denies any harmful dust or chemical exposures.  She is a never smoker.  She was exposed to secondhand smoke from her father until she was 53 years old.  Her father had COPD.  No other lung conditions noted in the family.  Past Medical History:  Diagnosis Date   Irritable bowel syndrome (IBS)    diet controlled   Tachycardia    occasional     Family History  Problem Relation Age of Onset   Graves' disease Mother    Breast cancer Mother 34   COPD Father    Breast cancer Maternal Aunt 72     Social History   Socioeconomic History   Marital  status: Married    Spouse name: Not on file   Number of children: Not on file   Years of education: Not on file   Highest education level: Not on file  Occupational History   Not on file  Tobacco Use   Smoking status: Never   Smokeless tobacco: Never  Vaping Use   Vaping Use: Never used  Substance and Sexual Activity   Alcohol use: Yes    Comment: occasional   Drug use: No   Sexual activity: Yes    Birth control/protection: None  Other Topics Concern   Not on file  Social History Narrative   Not on file   Social Determinants of Health   Financial Resource Strain: Not on file  Food Insecurity: Not on file  Transportation Needs: Not on file  Physical Activity: Not on file  Stress: Not on file  Social Connections: Not on file  Intimate Partner Violence: Not on file     No Known Allergies   Outpatient Medications Prior to Visit  Medication Sig Dispense Refill   albuterol (VENTOLIN HFA) 108 (90 Base) MCG/ACT inhaler Inhale 2 puffs into the lungs every 6 (six) hours as needed for wheezing or shortness of breath. 8 g 0   estradiol (VIVELLE-DOT) 0.025 MG/24HR Place 1 patch onto the skin 2 (two) times a week.  fluticasone (FLONASE) 50 MCG/ACT nasal spray Place 1 spray into both nostrils at bedtime.     omeprazole (PRILOSEC) 40 MG capsule Take 1 capsule (40 mg total) by mouth daily. 90 capsule 1   beclomethasone (QVAR) 80 MCG/ACT inhaler Inhale 1 puff into the lungs 2 (two) times daily. 1 each 3   famotidine (PEPCID) 20 MG tablet Take 1 tablet (20 mg total) by mouth 2 (two) times daily. 180 tablet 0   No facility-administered medications prior to visit.    Review of Systems  Constitutional:  Negative for chills, fever, malaise/fatigue and weight loss.  HENT:  Positive for congestion. Negative for sinus pain and sore throat.   Eyes: Negative.   Respiratory:  Positive for cough and shortness of breath. Negative for hemoptysis, sputum production and wheezing.    Cardiovascular:  Positive for chest pain. Negative for palpitations, orthopnea, claudication and leg swelling.  Gastrointestinal:  Positive for heartburn. Negative for abdominal pain, nausea and vomiting.  Genitourinary: Negative.   Musculoskeletal:  Negative for joint pain and myalgias.  Skin:  Negative for rash.  Neurological:  Negative for weakness.  Endo/Heme/Allergies:  Positive for environmental allergies.  Psychiatric/Behavioral: Negative.       Objective:   Vitals:   03/10/21 1333  BP: 112/72  Pulse: 76  SpO2: 99%  Weight: 154 lb 12.8 oz (70.2 kg)  Height: 5\' 4"  (1.626 m)     Physical Exam Constitutional:      General: She is not in acute distress.    Appearance: She is not ill-appearing.  HENT:     Head: Normocephalic and atraumatic.  Eyes:     General: No scleral icterus.    Conjunctiva/sclera: Conjunctivae normal.     Pupils: Pupils are equal, round, and reactive to light.  Cardiovascular:     Rate and Rhythm: Normal rate and regular rhythm.     Pulses: Normal pulses.     Heart sounds: Normal heart sounds. No murmur heard. Pulmonary:     Effort: Pulmonary effort is normal. Prolonged expiration present.     Breath sounds: Normal breath sounds. No wheezing, rhonchi or rales.  Abdominal:     General: Bowel sounds are normal.     Palpations: Abdomen is soft.  Musculoskeletal:     Right lower leg: No edema.     Left lower leg: No edema.  Lymphadenopathy:     Cervical: No cervical adenopathy.  Skin:    General: Skin is warm and dry.  Neurological:     General: No focal deficit present.     Mental Status: She is alert.  Psychiatric:        Mood and Affect: Mood normal.        Behavior: Behavior normal.        Thought Content: Thought content normal.        Judgment: Judgment normal.   CBC    Component Value Date/Time   WBC 6.4 02/23/2021 1416   RBC 4.49 02/23/2021 1416   HGB 13.3 02/23/2021 1416   HGB 12.6 06/29/2016 0819   HCT 39.4 02/23/2021  1416   HCT 37.7 06/29/2016 0819   PLT 272.0 02/23/2021 1416   PLT 281 06/29/2016 0819   MCV 87.7 02/23/2021 1416   MCV 89 06/29/2016 0819   MCH 29.6 06/29/2016 0819   MCH 29.5 04/05/2015 0500   MCHC 33.8 02/23/2021 1416   RDW 12.8 02/23/2021 1416   RDW 13.7 06/29/2016 0819   LYMPHSABS 1.6 02/23/2021 1416   LYMPHSABS  1.6 06/29/2016 0819   MONOABS 0.4 02/23/2021 1416   EOSABS 0.6 02/23/2021 1416   EOSABS 0.2 06/29/2016 0819   BASOSABS 0.1 02/23/2021 1416   BASOSABS 0.0 06/29/2016 0819   BMP Latest Ref Rng & Units 02/23/2021 01/03/2021 05/21/2018  Glucose 70 - 99 mg/dL 86 94 95  BUN 6 - 23 mg/dL 11 12 11   Creatinine 0.40 - 1.20 mg/dL 1.61 0.96  BUN/Creat Ratio 9 - 23 - - -  Sodium 135 - 145 mEq/L 138 138 135  Potassium 3.5 - 5.1 mEq/L 3.6 4.0 3.9  Chloride 96 - 112 mEq/L 101 102 100  CO2 19 - 32 mEq/L 30 30 29   Calcium 8.4 - 10.5 mg/dL 9.0 9.0 9.4   Chest imaging: CXR 01/03/2021 The heart size and mediastinal contours are within normal limits. Both lungs are clear. The visualized skeletal structures are unremarkable.  PFT: No flowsheet data found.  Labs:  Path:  Echo:  Heart Catheterization:  Assessment & Plan:   Post-viral reactive airway disease - Plan: fluticasone furoate-vilanterol (BREO ELLIPTA) 200-25 MCG/ACT AEPB  Post-nasal drainage - Plan: ipratropium (ATROVENT) 0.03 % nasal spray  Seasonal allergies - Plan: montelukast (SINGULAIR) 10 MG tablet  Discussion: Alexis Herman is a 50 year old woman, never smoker who is referred to pulmonary clinic for chronic cough.   Her cough is secondary to postviral reactive airways disease, sinus congestion with postnasal drip and seasonal allergies.  Chest radiograph from 12/2020 is unremarkable.  We will start her on Breo Ellipta 200-25 MCG 1 puff daily.  She is to stop using Qvar while using Breo.  She can continue to use albuterol 1 to 2 puffs every 4-6 hours as needed.  She is to start montelukast 10 mg  daily for her reactive airways disease and seasonal allergies.  She can continue to take Zyrtec as needed for allergies.  She is to continue fluticasone nasal spray 1 spray per nostril daily.  We will add ipratropium nasal spray 1 to 2 sprays every 12 hours as needed.  She is to follow-up in 2 months.  55, MD [facility] Office: [phone]   Current Outpatient Medications:    albuterol (VENTOLIN HFA) 108 (90 Base) MCG/ACT inhaler, Inhale 2 puffs into the lungs every 6 (six) hours as needed for wheezing or shortness of breath., Disp: 8 g, Rfl: 0   estradiol (VIVELLE-DOT) 0.025 MG/24HR, Place 1 patch onto the skin 2 (two) times a week., Disp: , Rfl:    fluticasone (FLONASE) 50 MCG/ACT nasal spray, Place 1 spray into both nostrils at bedtime., Disp: , Rfl:    fluticasone furoate-vilanterol (BREO ELLIPTA) 200-25 MCG/ACT AEPB, Inhale 1 puff into the lungs daily., Disp: 28 each, Rfl: 6   ipratropium (ATROVENT) 0.03 % nasal spray, Place 1-2 sprays into both nostrils every 12 (twelve) hours as needed for rhinitis., Disp: 30 mL, Rfl: 12   montelukast (SINGULAIR) 10 MG tablet, Take 1 tablet (10 mg total) by mouth at bedtime., Disp: 30 tablet, Rfl: 11   omeprazole (PRILOSEC) 40 MG capsule, Take 1 capsule (40 mg total) by mouth daily., Disp: 90 capsule, Rfl: 1

## 2021-03-13 ENCOUNTER — Ambulatory Visit
Admission: RE | Admit: 2021-03-13 | Discharge: 2021-03-13 | Disposition: A | Discharge status: Home / Self Care | Payer: BC Managed Care – PPO | Source: Ambulatory Visit | Attending: Gastroenterology | Admitting: Gastroenterology

## 2021-03-13 DIAGNOSIS — R109 Unspecified abdominal pain: Secondary | ICD-10-CM

## 2021-03-13 DIAGNOSIS — R112 Nausea with vomiting, unspecified: Secondary | ICD-10-CM

## 2021-04-01 ENCOUNTER — Other Ambulatory Visit: Payer: Self-pay | Admitting: Pulmonary Disease

## 2021-04-01 DIAGNOSIS — J302 Other seasonal allergic rhinitis: Secondary | ICD-10-CM

## 2021-04-01 DIAGNOSIS — R0982 Postnasal drip: Secondary | ICD-10-CM

## 2021-05-31 ENCOUNTER — Ambulatory Visit (INDEPENDENT_AMBULATORY_CARE_PROVIDER_SITE_OTHER): Payer: BC Managed Care – PPO | Admitting: Podiatry

## 2021-05-31 ENCOUNTER — Encounter: Payer: Self-pay | Admitting: Podiatry

## 2021-05-31 ENCOUNTER — Other Ambulatory Visit: Payer: Self-pay

## 2021-05-31 ENCOUNTER — Ambulatory Visit (INDEPENDENT_AMBULATORY_CARE_PROVIDER_SITE_OTHER): Payer: BC Managed Care – PPO

## 2021-05-31 DIAGNOSIS — M7751 Other enthesopathy of right foot: Secondary | ICD-10-CM | POA: Diagnosis not present

## 2021-05-31 DIAGNOSIS — M779 Enthesopathy, unspecified: Secondary | ICD-10-CM

## 2021-05-31 DIAGNOSIS — M205X1 Other deformities of toe(s) (acquired), right foot: Secondary | ICD-10-CM

## 2021-05-31 MED ORDER — TRIAMCINOLONE ACETONIDE 10 MG/ML IJ SUSP
10.0000 mg | Freq: Once | INTRAMUSCULAR | Status: AC
  Administered 2021-05-31: 10 mg

## 2021-05-31 NOTE — Progress Notes (Signed)
Subjective:   Patient ID: Alexis Herman, female   DOB: 51 y.o.   MRN: [id number]   HPI Patient presents stating that she has been developing a lot of pain around her right foot over the last few months and does not remember injury.  It is worse with certain types of shoes but is gradually getting worse as time goes on and she feels like she is limping.  Patient does not smoke likes to be active and has had sisters had bunion surgery and does have periodic discomfort around the big toe joint bilateral   Review of Systems  All other systems reviewed and are negative.      Objective:  Physical Exam Vitals and nursing note reviewed.  Constitutional:      Appearance: She is well-developed.  Pulmonary:     Effort: Pulmonary effort is normal.  Musculoskeletal:        General: Normal range of motion.  Skin:    General: Skin is warm.  Neurological:     Mental Status: She is alert.    Neurovascular status found to be intact muscle strength adequate range of motion adequate.  I did note inflammation with fluid buildup around the second metatarsal phalangeal joint right with mild elevation of the first metatarsal segment and what appears to be some forms of functional hallux limitus.  Left foot shows mild functional hallux limitus not to the same degree and patient is found to have good digital perfusion well oriented x3     Assessment:  Inflammatory capsulitis second MPJ right with possibility for mid grade hallux limitus deformity functional in nature right over left     Plan:  H&P x-rays reviewed condition discussed.  Organ to focus on the joint even though educated on hallux limitus today and possibility for a orthotic with a reverse Morton's extension.  Today I did a proximal nerve block of the right forefoot I then did sterile prep and aspirated the second MPJ getting out a small amount of clear fluid injected quarter cc dexamethasone Kenalog applied thick plantar padding to reduce  pressure on the joint advised on rigid bottom shoes and reappoint to recheck again in the next several weeks  X-rays indicate that there is slight changes around the first MPJ with small bone spur formation no pathology of the second MPJ noted from a radiographic perspective

## 2021-06-14 ENCOUNTER — Ambulatory Visit (INDEPENDENT_AMBULATORY_CARE_PROVIDER_SITE_OTHER): Payer: BC Managed Care – PPO | Admitting: Podiatry

## 2021-06-14 ENCOUNTER — Encounter: Payer: Self-pay | Admitting: Podiatry

## 2021-06-14 ENCOUNTER — Other Ambulatory Visit: Payer: Self-pay

## 2021-06-14 DIAGNOSIS — M7751 Other enthesopathy of right foot: Secondary | ICD-10-CM

## 2021-06-14 DIAGNOSIS — M205X1 Other deformities of toe(s) (acquired), right foot: Secondary | ICD-10-CM

## 2021-06-14 MED ORDER — PREDNISONE 10 MG PO TABS: ORAL_TABLET | ORAL | 0 refills | Status: DC

## 2021-06-14 NOTE — Progress Notes (Signed)
Subjective:   Patient ID: Alexis Herman, female   DOB: 51 y.o.   MRN: [id number]   HPI Patient presents stating she is still having discomfort in the base of her toes.  Stated improved the first week but when she returned to her job where she had to wear a nice heel toe shoe created stress and the pain has returned.  She also is not able to do the activities she wants   ROS      Objective:  Physical Exam  Neurovascular status intact inflammation around the second MPJ right which is quite stubborn and painful with swelling with reduced range of motion with moderate hallux valgus limitus deformity right over left      Assessment:  Inflammatory capsulitis second MPJ right which remains stubborn and still giving her problems with mild hallux limitus which could be contributory     Plan:  H&P reviewed condition and went ahead today and applied air fracture walker to completely immobilize the bottom of the foot along with placing her on Sterapred DS Dosepak to try to reduce the inflammation and reappoint 4 weeks.  May require orthotics or other treatment and possibly shortening osteotomy if we cannot get symptoms under control

## 2021-07-12 ENCOUNTER — Ambulatory Visit (INDEPENDENT_AMBULATORY_CARE_PROVIDER_SITE_OTHER): Payer: BC Managed Care – PPO

## 2021-07-12 ENCOUNTER — Other Ambulatory Visit: Payer: Self-pay

## 2021-07-12 ENCOUNTER — Encounter: Payer: Self-pay | Admitting: Podiatry

## 2021-07-12 ENCOUNTER — Ambulatory Visit (INDEPENDENT_AMBULATORY_CARE_PROVIDER_SITE_OTHER): Payer: BC Managed Care – PPO | Admitting: Podiatry

## 2021-07-12 DIAGNOSIS — M7751 Other enthesopathy of right foot: Secondary | ICD-10-CM | POA: Diagnosis not present

## 2021-07-12 DIAGNOSIS — M79674 Pain in right toe(s): Secondary | ICD-10-CM

## 2021-07-12 MED ORDER — TRIAMCINOLONE ACETONIDE 10 MG/ML IJ SUSP
10.0000 mg | Freq: Once | INTRAMUSCULAR | Status: AC
  Administered 2021-07-12: 10 mg

## 2021-07-12 NOTE — Progress Notes (Signed)
Subjective:  ? ?Patient ID: Alexis Herman, female   DOB: 51 y.o.   MRN: [id number]  ? ?HPI ?Patient states she has had some improvement of her right second joint but it still is quite sore and swollen and she has tried to wear her boot is much as possible and try to use stiff bottom shoes ? ? ?ROS ? ? ?   ?Objective:  ?Physical Exam  ?Neuro vascular status intact with patient still found to have stiffness around the second MPJ and enlargement of the joint surface with fluid buildup with improvement from previous but still painful ? ?   ?Assessment:  ?Inflammatory capsulitis still present second MPJ right with mild elongation of the metatarsal and mild elevation of the first metatarsal segment ? ?   ?Plan:  ?H&P and I rex-ray precautionary to rule out stress fracture and I did go ahead today I did anesthetized the area and 1 more time aspirated getting out a clear fluid injected quarter cc dexamethasone applied thick plantar fat padding advised on continued boot stiff bottom shoe usage and will be seen back in 6 weeks.  Possible that shortening osteotomy may be necessary in future but we Argun to give this a true try at conservative care and see how she responds ? ?X-rays indicate no signs of stress fracture or arthritis second of tarsal mild elongation and slight deviation of the second toe ?   ? ? ?

## 2021-07-14 ENCOUNTER — Other Ambulatory Visit: Payer: Self-pay | Admitting: Family Medicine

## 2021-07-17 ENCOUNTER — Other Ambulatory Visit: Payer: Self-pay | Admitting: Podiatry

## 2021-07-17 DIAGNOSIS — M7751 Other enthesopathy of right foot: Secondary | ICD-10-CM

## 2021-08-23 ENCOUNTER — Ambulatory Visit (INDEPENDENT_AMBULATORY_CARE_PROVIDER_SITE_OTHER): Payer: BC Managed Care – PPO | Admitting: Podiatry

## 2021-08-23 ENCOUNTER — Encounter: Payer: Self-pay | Admitting: Podiatry

## 2021-08-23 DIAGNOSIS — M7751 Other enthesopathy of right foot: Secondary | ICD-10-CM | POA: Diagnosis not present

## 2021-08-23 NOTE — Progress Notes (Signed)
Subjective:  ? ?Patient ID: Alexis Herman, female   DOB: 51 y.o.   MRN: [id number]  ? ?HPI ?Patient presents stating that her right foot is improving with mild discomfort still noted especially with different types of shoe gear ? ? ?ROS ? ? ?   ?Objective:  ?Physical Exam  ?Neurovascular status intact with inflammation still present around the second MPJ that is improved but is still painful upon deep palpation ? ?   ?Assessment:  ?Improvement of capsulitis second MPJ right with continued discomfort but improvement with conservative treatment ? ?   ?Plan:  ?Continue with padding to offload weight off the joint surface along with rigid bottom shoes discussed long-term orthotics or other treatments if symptoms were to get worse again or persist.  Patient's discharge will be seen back as needed ?   ? ? ?

## 2021-10-02 IMAGING — US US BREAST*R* LIMITED INC AXILLA
1 series · 13 of 23 positions shown · non-contrast
Comparison: Previous exam(s).

CLINICAL DATA: Two palpable abnormalities in the RIGHT breast.

EXAM:
DIGITAL DIAGNOSTIC BILATERAL MAMMOGRAM WITH TOMOSYNTHESIS AND CAD;
ULTRASOUND RIGHT BREAST LIMITED
TECHNIQUE: Bilateral digital diagnostic mammography and breast tomosynthesis
was performed. The images were evaluated with computer-aided
detection.; Targeted ultrasound examination of the right breast was
performed

[Series 1: us breast*right* limited inc axilla · 0.07mm/px · 13 of 23 slices shown]
[im 1/23]
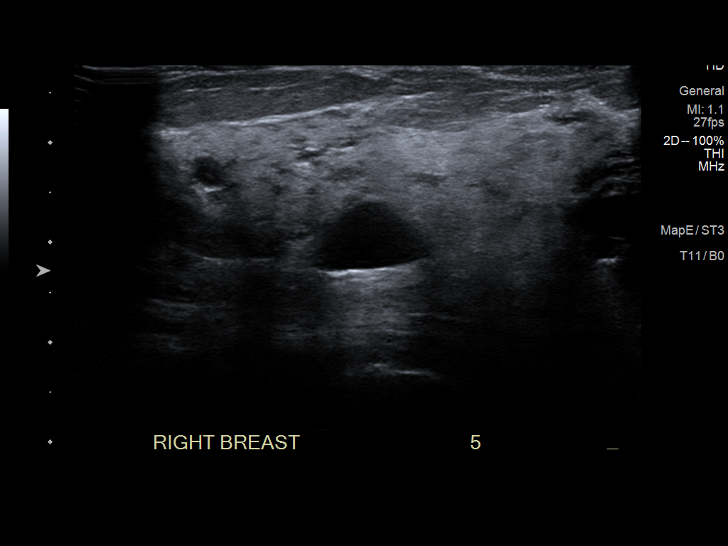
[im 3/23]
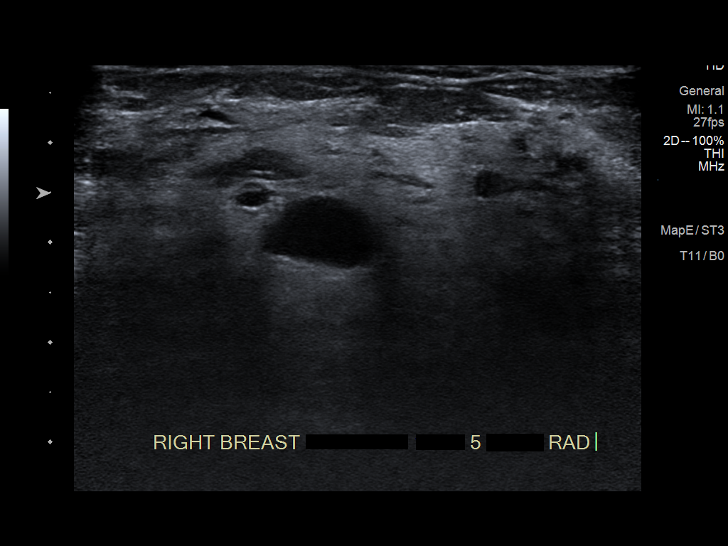
[im 5/23]
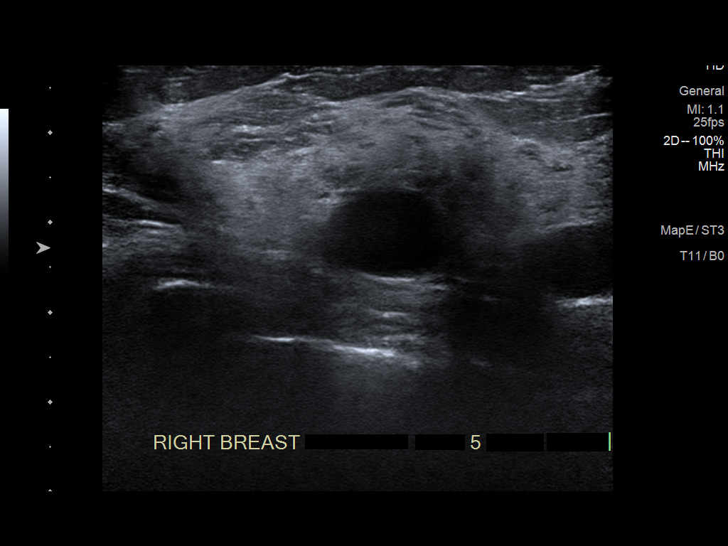
[im 7/23]
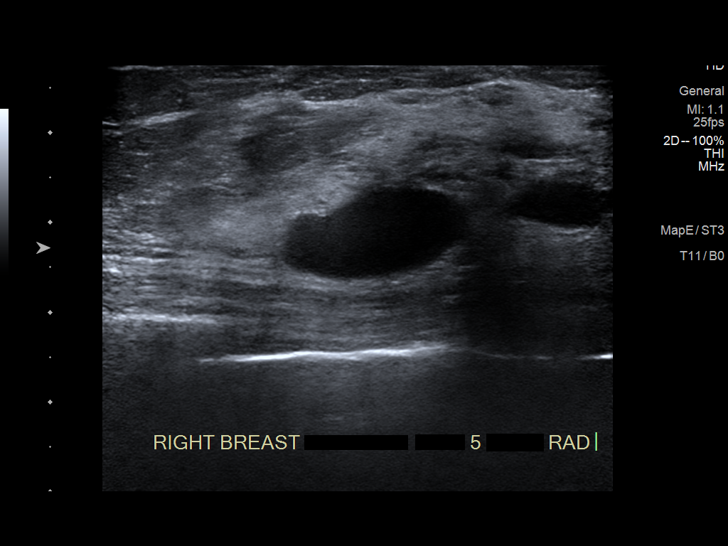
[im 8/23]
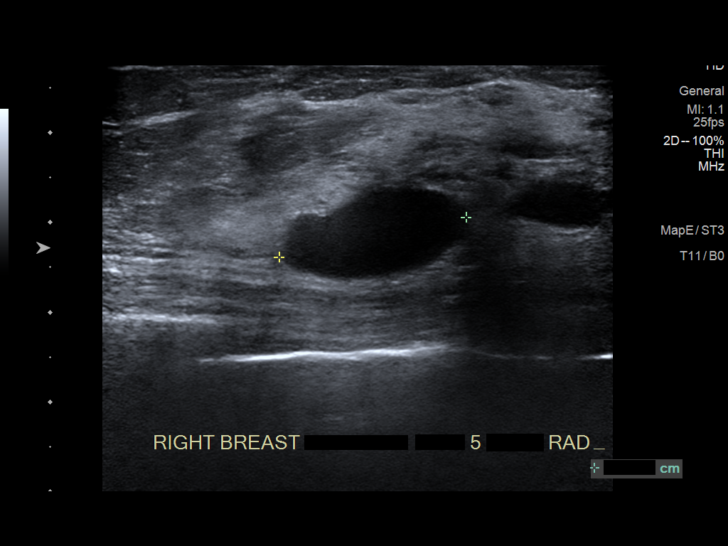
[im 10/23]
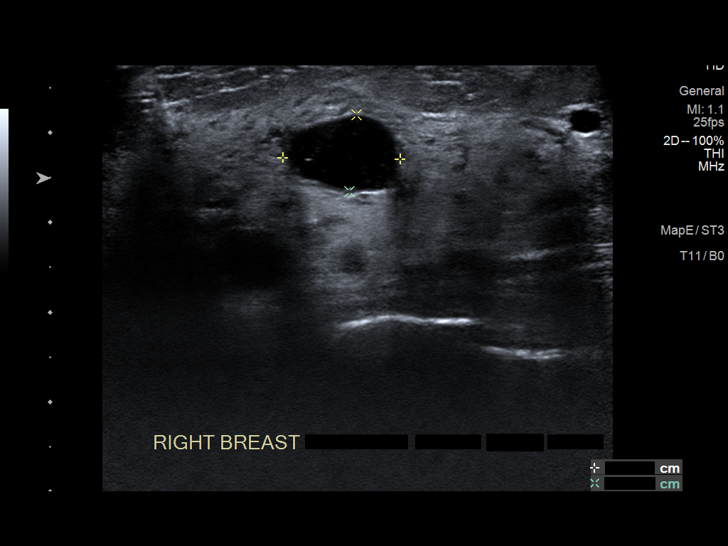
[im 12/23]
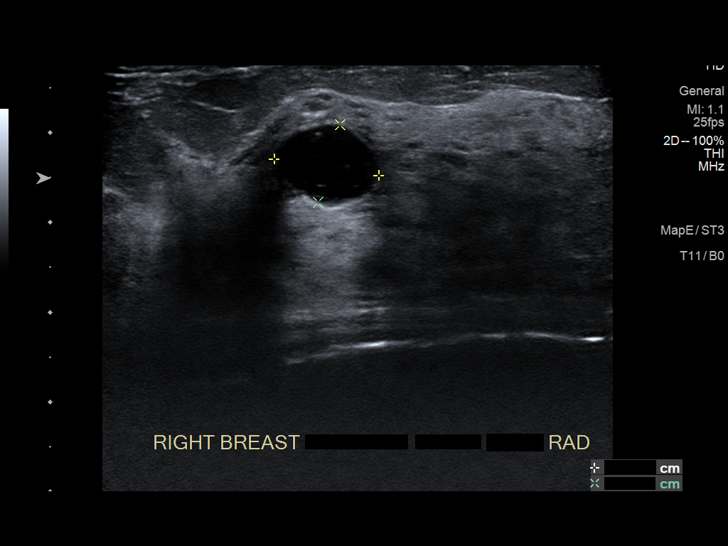
[im 14/23]
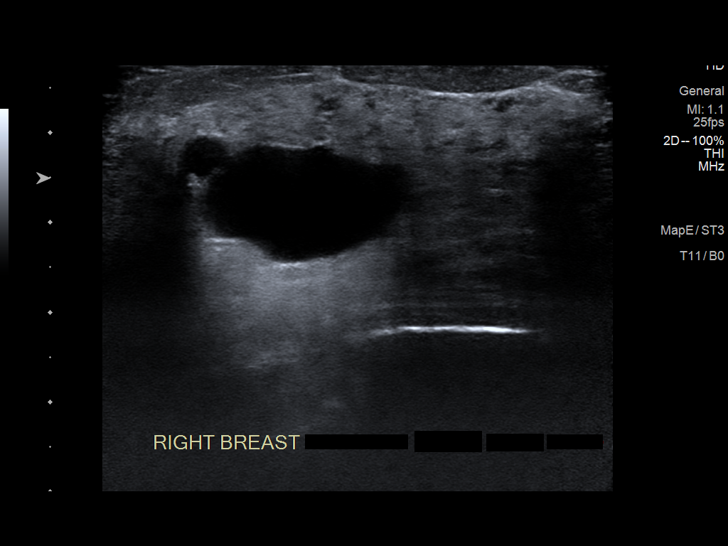
[im 16/23]
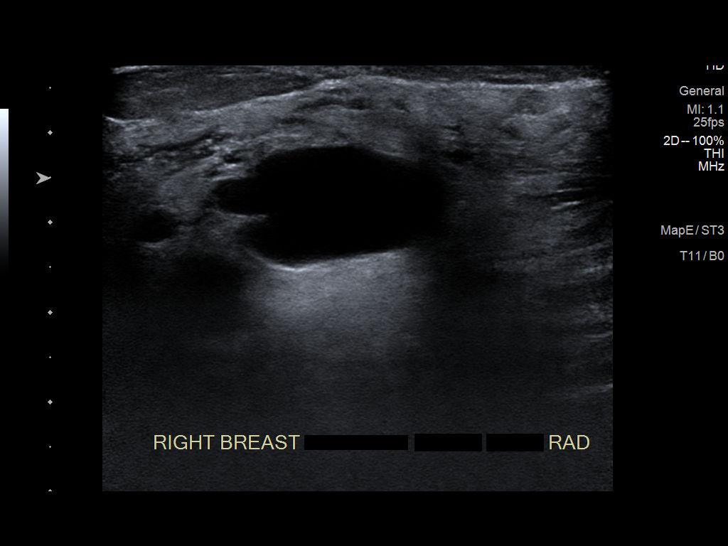
[im 17/23]
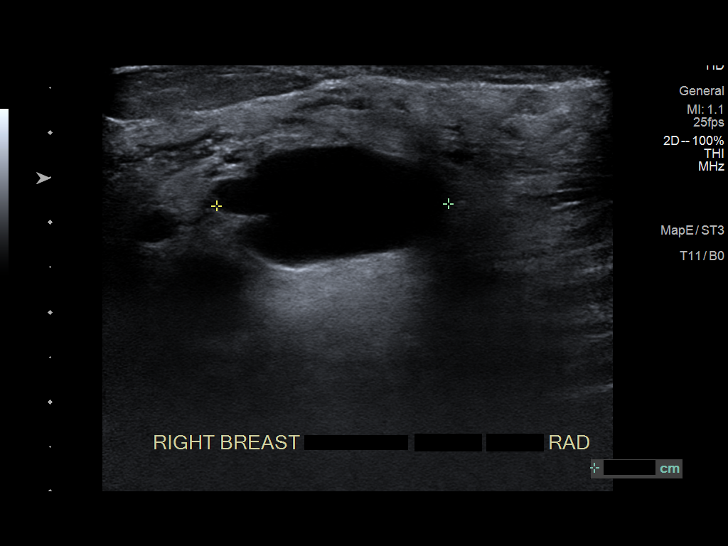
[im 19/23]
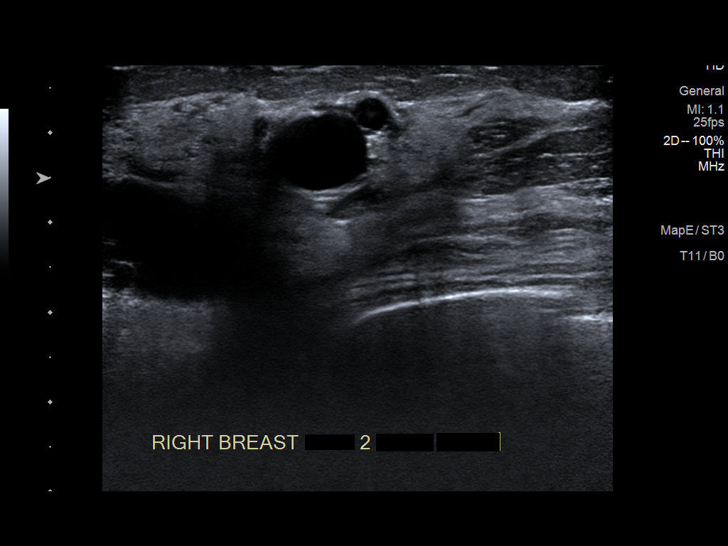
[im 21/23]
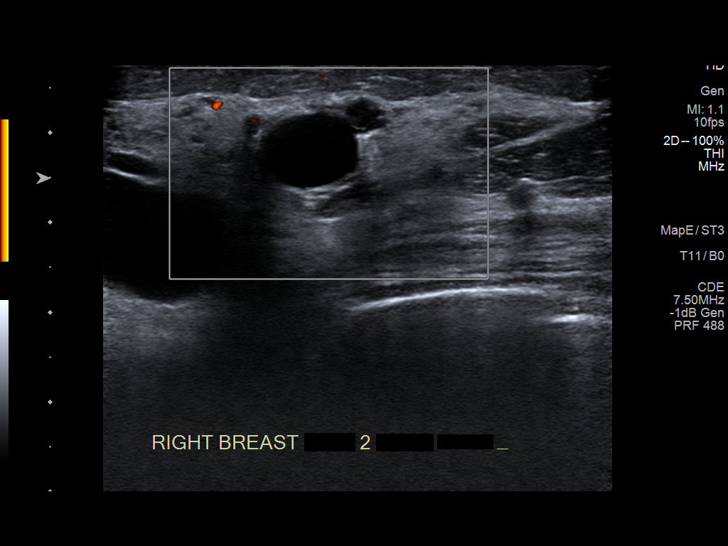
[im 23/23]
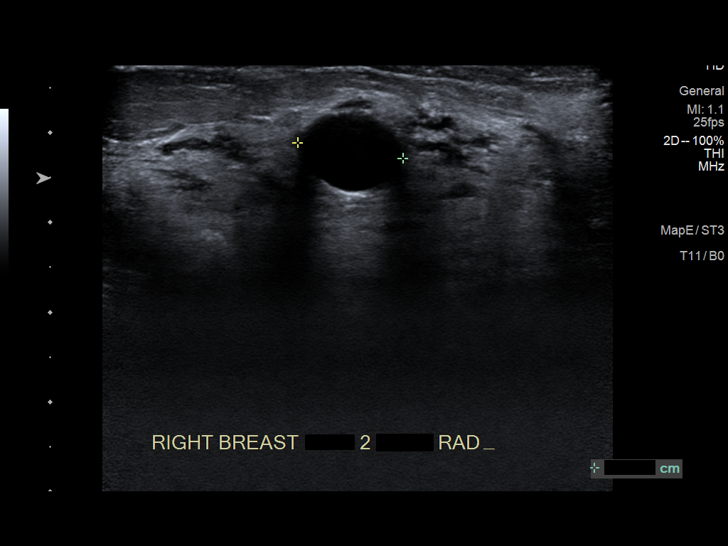

[13 of 23 positions shown; findings below may reference images not displayed]

ACR Breast Density Category c: The breast tissue is heterogeneously
dense, which may obscure small masses.
FINDINGS: Within the UPPER-OUTER QUADRANT there are numerous small partially
obscured oval masses. LEFT breast is negative.

On physical exam, I palpate movable round smooth masses in the
UPPER-OUTER QUADRANT of the RIGHT breast corresponding to the areas
of patient's concern. Patient is slightly tender on exam of the
UPPER OUTER QUADRANT.

Targeted ultrasound is performed, showing numerous cysts throughout
the UPPER-OUTER QUADRANT of the RIGHT breast, varying in size from
1.2 to 2.6 centimeters. No solid masses or areas of acoustic
shadowing identified.
IMPRESSION: 1.  No mammographic or ultrasound evidence for malignancy.
2. Numerous benign cysts in the RIGHT breast.
3. We discussed the option of cyst aspiration if needed. The patient
does not feel she needs cyst aspiration at this time.

RECOMMENDATION:
Screening mammogram in one year.(Code:[id number]-E-[id number])

I have discussed the findings and recommendations with the patient.
If applicable, a reminder letter will be sent to the patient
regarding the next appointment.

BI-RADS CATEGORY  2: Benign.

## 2022-02-26 ENCOUNTER — Ambulatory Visit (INDEPENDENT_AMBULATORY_CARE_PROVIDER_SITE_OTHER): Payer: BC Managed Care – PPO | Admitting: Family Medicine

## 2022-02-26 ENCOUNTER — Encounter: Payer: Self-pay | Admitting: Family Medicine

## 2022-02-26 VITALS — BP 116/80 | HR 82 | Temp 98.8°F | Resp 16 | Ht 64.0 in | Wt 156.5 lb

## 2022-02-26 DIAGNOSIS — Z23 Encounter for immunization: Secondary | ICD-10-CM

## 2022-02-26 DIAGNOSIS — Z0000 Encounter for general adult medical examination without abnormal findings: Secondary | ICD-10-CM | POA: Diagnosis not present

## 2022-02-26 DIAGNOSIS — E663 Overweight: Secondary | ICD-10-CM | POA: Diagnosis not present

## 2022-02-26 LAB — HEPATIC FUNCTION PANEL
ALT: 12 U/L (ref 0–35)
AST: 15 U/L (ref 0–37)
Albumin: 4 g/dL (ref 3.5–5.2)
Alkaline Phosphatase: 52 U/L (ref 39–117)
Bilirubin, Direct: 0.1 mg/dL (ref 0.0–0.3)
Total Bilirubin: 0.4 mg/dL (ref 0.2–1.2)
Total Protein: 6.4 g/dL (ref 6.0–8.3)

## 2022-02-26 LAB — LIPID PANEL
Cholesterol: 172 mg/dL (ref 0–200)
HDL: 61.7 mg/dL (ref >39.00)
LDL Cholesterol: 89 mg/dL (ref 0–99)
NonHDL: 109.88
Total CHOL/HDL Ratio: 3
Triglycerides: 103 mg/dL (ref 0.0–149.0)
VLDL: 20.6 mg/dL (ref 0.0–40.0)

## 2022-02-26 LAB — CBC WITH DIFFERENTIAL/PLATELET
Basophils Absolute: 0.1 10*3/uL (ref 0.0–0.1)
Basophils Relative: 0.9 % (ref 0.0–3.0)
Eosinophils Absolute: 0.3 10*3/uL (ref 0.0–0.7)
Eosinophils Relative: 5.8 % — ABNORMAL HIGH (ref 0.0–5.0)
HCT: 39 % (ref 36.0–46.0)
Hemoglobin: 13 g/dL (ref 12.0–15.0)
Lymphocytes Relative: 26.9 % (ref 12.0–46.0)
Lymphs Abs: 1.5 10*3/uL (ref 0.7–4.0)
MCHC: 33.4 g/dL (ref 30.0–36.0)
MCV: 89.4 fl (ref 78.0–100.0)
Monocytes Absolute: 0.4 10*3/uL (ref 0.1–1.0)
Monocytes Relative: 7.1 % (ref 3.0–12.0)
Neutro Abs: 3.4 10*3/uL (ref 1.4–7.7)
Neutrophils Relative %: 59.3 % (ref 43.0–77.0)
Platelets: 283 10*3/uL (ref 150.0–400.0)
RBC: 4.36 Mil/uL (ref 3.87–5.11)
RDW: 12.6 % (ref 11.5–15.5)
WBC: 5.7 10*3/uL (ref 4.0–10.5)

## 2022-02-26 LAB — BASIC METABOLIC PANEL
BUN: 14 mg/dL (ref 6–23)
CO2: 30 mEq/L (ref 19–32)
Calcium: 8.9 mg/dL (ref 8.4–10.5)
Chloride: 103 mEq/L (ref 96–112)
Creatinine, Ser: 0.7 mg/dL (ref 0.40–1.20)
GFR: 100.61 mL/min (ref >60.00)
Glucose, Bld: 96 mg/dL (ref 70–99)
Potassium: 3.9 mEq/L (ref 3.5–5.1)
Sodium: 139 mEq/L (ref 135–145)

## 2022-02-26 LAB — TSH: TSH: 1.64 u[IU]/mL (ref 0.35–5.50)

## 2022-02-26 NOTE — Patient Instructions (Signed)
Follow up in 1 year or as needed We'll notify you of your lab results and make any changes if needed Continue to work on healthy diet and regular exercise- you look great! Call with any questions or concerns Stay Safe!  Stay Healthy! Happy Fall!!! 

## 2022-02-26 NOTE — Assessment & Plan Note (Signed)
Pt's PE WNL.  UTD on mammo, colonoscopy, Tdap.  Flu shot given.  Check labs.  Anticipatory guidance provided.

## 2022-02-26 NOTE — Progress Notes (Signed)
   Subjective:    Patient ID: Alexis Herman, female    DOB: Aug 16, 1970, 51 y.o.   MRN: [id number]  HPI CPE- UTD on mammo, colonoscopy, Tdap.  Will get flu shot today  Patient Care Team    Relationship Specialty Notifications Start End  Midge Minium, MD PCP - General Family Medicine  12/07/16   Jerline Pain, MD PCP - Cardiology Cardiology  05/07/18   Evans Lance, MD Consulting Physician Cardiology  12/07/16   Jerelyn Charles, MD Consulting Physician Obstetrics  12/07/16     Health Maintenance  Topic Date Due   INFLUENZA VACCINE  11/28/2021   Zoster Vaccines- Shingrix (1 of 2) 05/29/2022 (Originally 05/30/2021)   MAMMOGRAM  03/15/2022   COLONOSCOPY (Pts 45-20yrs Insurance coverage will need to be confirmed)  11/28/2023   TETANUS/TDAP  12/08/2026   HPV VACCINES  Aged Out   COVID-19 Vaccine  Discontinued   Hepatitis C Screening  Discontinued   HIV Screening  Discontinued      Review of Systems Patient reports no vision/ hearing changes, adenopathy,fever, weight change,  persistant/recurrent hoarseness , swallowing issues, chest pain, palpitations, edema, persistant/recurrent cough, hemoptysis, dyspnea (rest/exertional/paroxysmal nocturnal), gastrointestinal bleeding (melena, rectal bleeding), abdominal pain, significant heartburn, bowel changes, GU symptoms (dysuria, hematuria, incontinence), Gyn symptoms (abnormal  bleeding, pain),  syncope, focal weakness, memory loss, numbness & tingling, skin/hair/nail changes, abnormal bruising or bleeding, anxiety, or depression.     Objective:   Physical Exam General Appearance:    Alert, cooperative, no distress, appears stated age  Head:    Normocephalic, without obvious abnormality, atraumatic  Eyes:    PERRL, conjunctiva/corneas clear, EOM's intact both eyes  Ears:    Normal TM's and external ear canals, both ears  Nose:   Nares normal, septum midline, mucosa normal, no drainage    or sinus tenderness  Throat:   Lips, mucosa,  and tongue normal; teeth and gums normal  Neck:   Supple, symmetrical, trachea midline, no adenopathy;    Thyroid: no enlargement/tenderness/nodules  Back:     Symmetric, no curvature, ROM normal, no CVA tenderness  Lungs:     Clear to auscultation bilaterally, respirations unlabored  Chest Wall:    No tenderness or deformity   Heart:    Regular rate and rhythm, S1 and S2 normal, no murmur, rub   or gallop  Breast Exam:    Deferred to GYN  Abdomen:     Soft, non-tender, bowel sounds active all four quadrants,    no masses, no organomegaly  Genitalia:    Deferred to GYN  Rectal:    Extremities:   Extremities normal, atraumatic, no cyanosis or edema  Pulses:   2+ and symmetric all extremities  Skin:   Skin color, texture, turgor normal, no rashes or lesions  Lymph nodes:   Cervical, supraclavicular, and axillary nodes normal  Neurologic:   CNII-XII intact, normal strength, sensation and reflexes    throughout          Assessment & Plan:

## 2022-02-27 NOTE — Progress Notes (Signed)
Pt seen results via my chart  

## 2022-03-17 ENCOUNTER — Other Ambulatory Visit: Payer: Self-pay | Admitting: Family Medicine

## 2022-03-17 ENCOUNTER — Other Ambulatory Visit: Payer: Self-pay | Admitting: Pulmonary Disease

## 2022-03-17 DIAGNOSIS — J45998 Other asthma: Secondary | ICD-10-CM

## 2022-03-19 ENCOUNTER — Encounter: Payer: Self-pay | Admitting: Family Medicine

## 2022-03-19 MED ORDER — ALBUTEROL SULFATE HFA 108 (90 BASE) MCG/ACT IN AERS: 2.0000 | INHALATION_SPRAY | Freq: Four times a day (QID) | RESPIRATORY_TRACT | 0 refills | Status: DC | PRN

## 2022-03-19 NOTE — Telephone Encounter (Signed)
Requesting allergie medication and refill inhaler should I tell her an appt is required for allergy medications

## 2022-03-31 IMAGING — US US ABDOMEN COMPLETE
1 series · 14 of 25 positions shown · non-contrast
Comparison: None.

CLINICAL DATA: Abdominal pain, nausea vomiting.

EXAM:
ABDOMEN ULTRASOUND COMPLETE

[Series 1: us abdomen complete · 0.23mm/px · 14 of 89 slices shown]
[im 1/89]
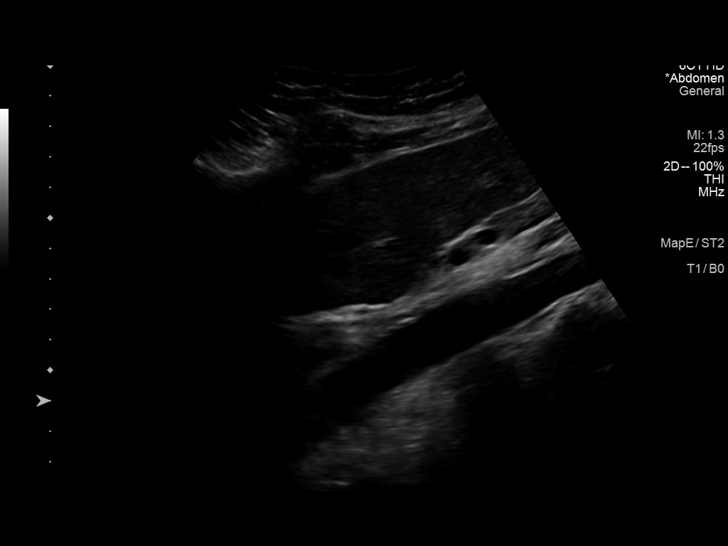
[im 8/89]
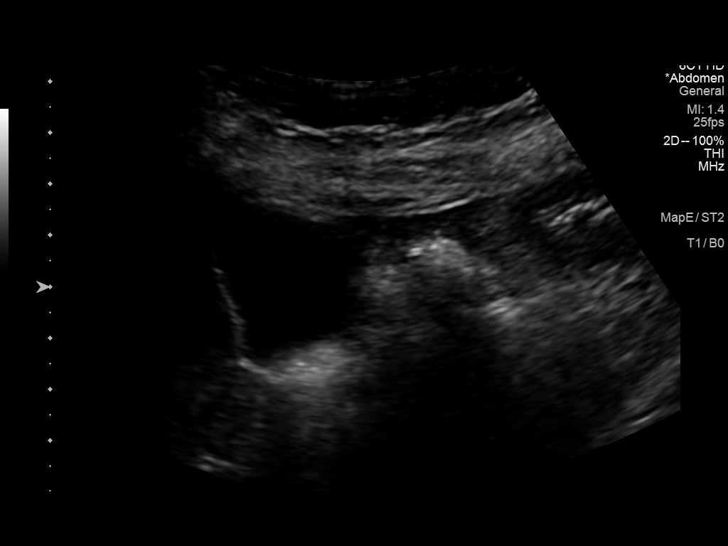
[im 15/89]
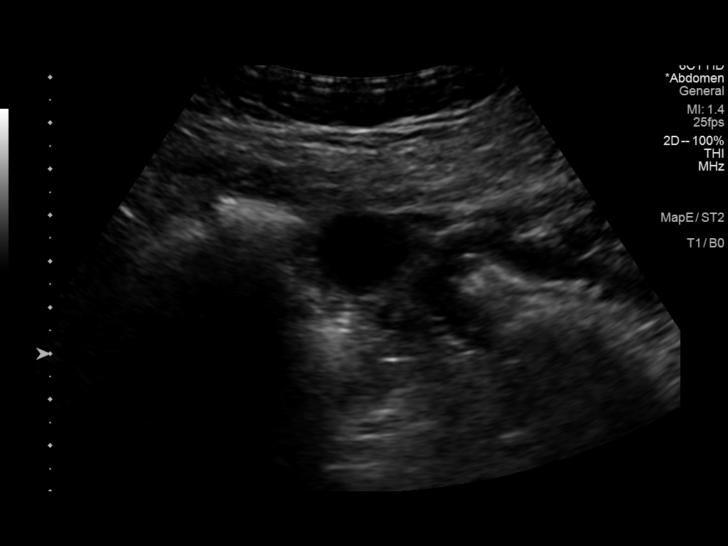
[im 23/89]
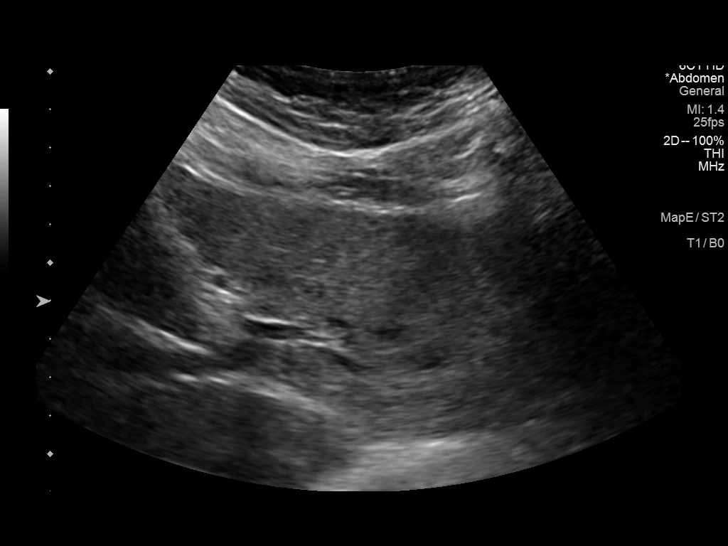
[im 30/89]
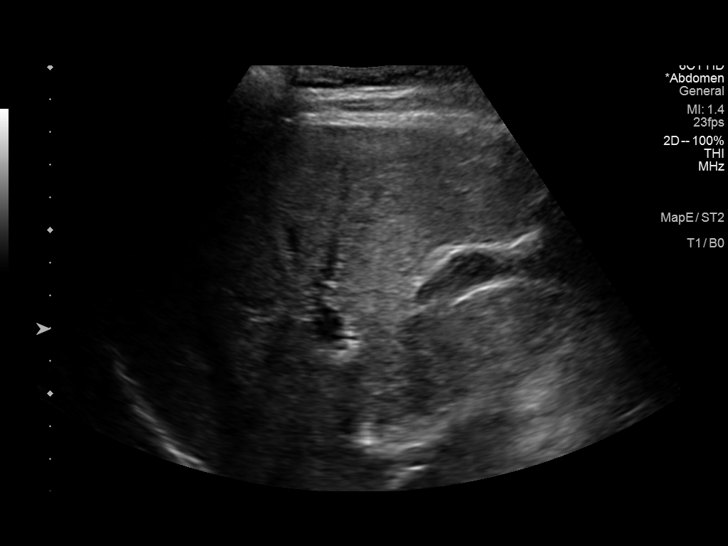
[im 34/89]
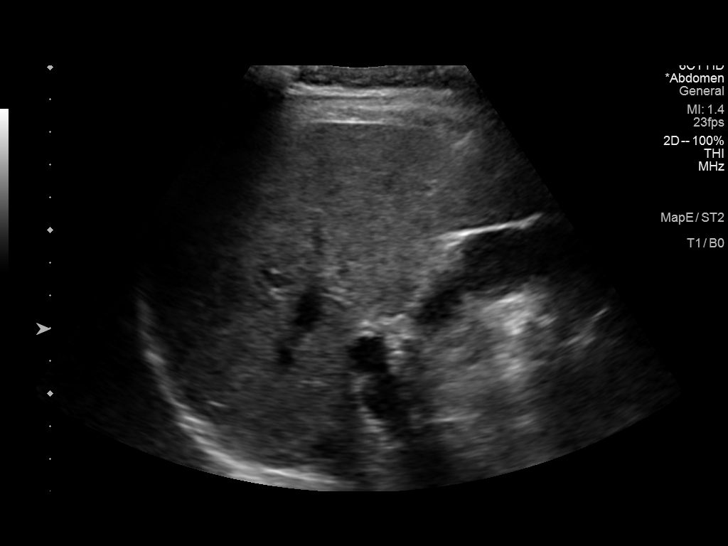
[im 41/89]
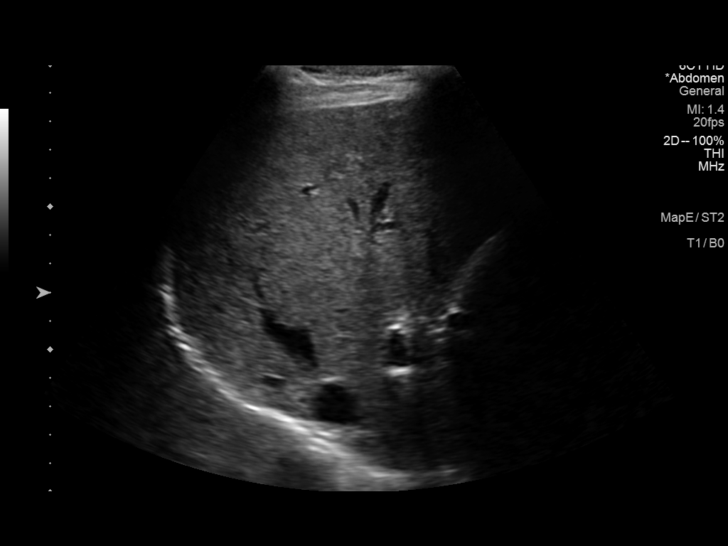
[im 48/89]
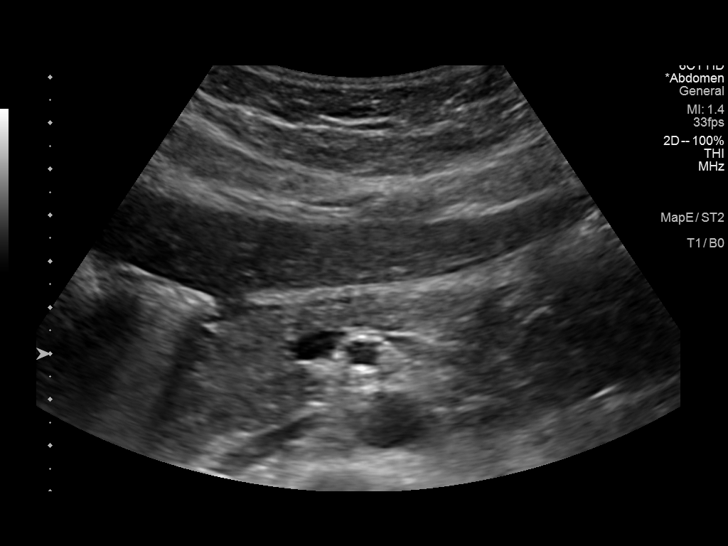
[im 56/89]
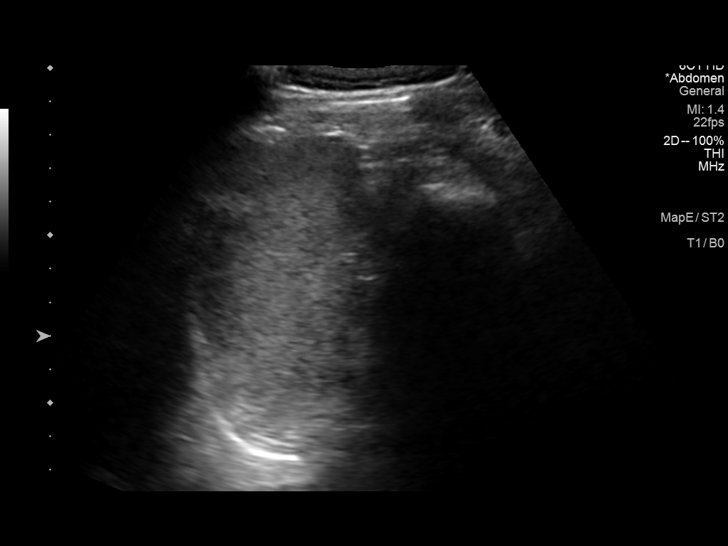
[im 59/89]
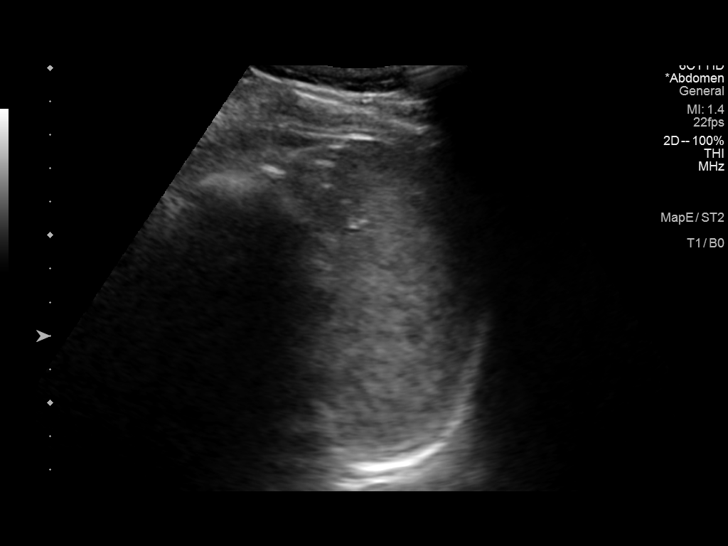
[im 67/89]
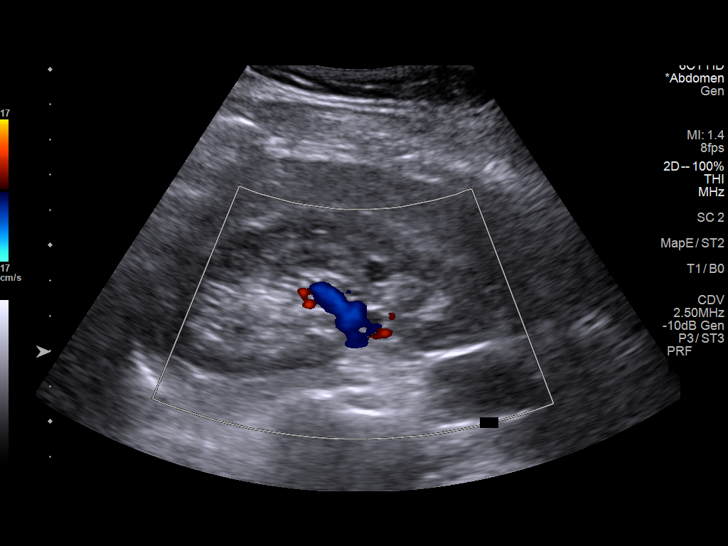
[im 74/89]
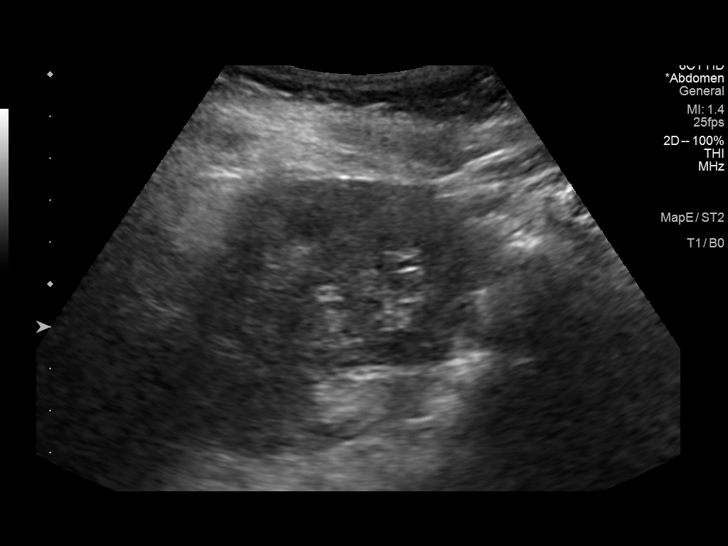
[im 81/89]
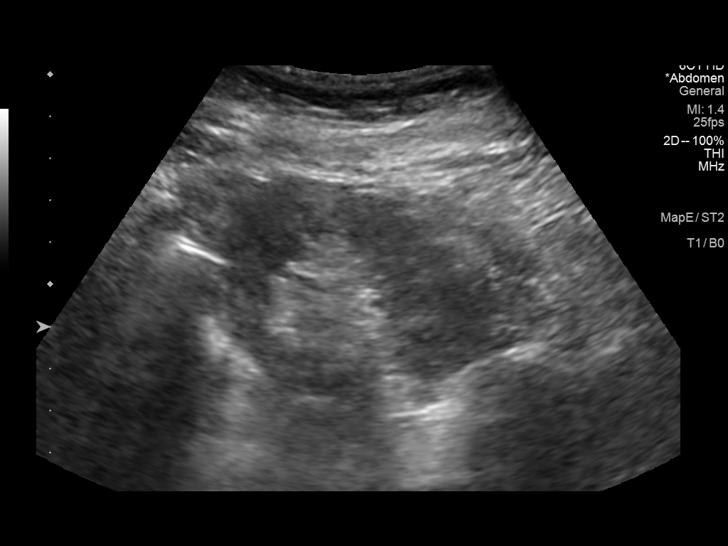
[im 89/89]
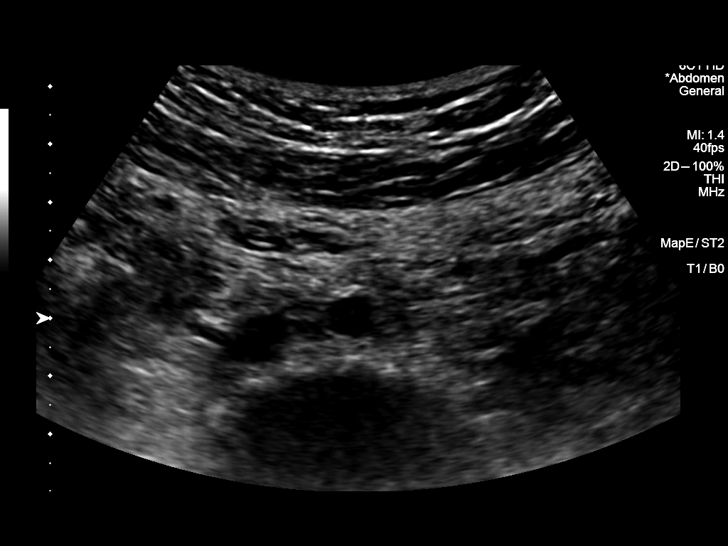

[14 of 25 positions shown; findings below may reference images not displayed]

FINDINGS: Gallbladder: No gallstones or wall thickening visualized. No
sonographic Murphy sign noted by sonographer.

Common bile duct: Diameter: 5 mm

Liver: No focal lesion identified. Within normal limits in
parenchymal echogenicity. Portal vein is patent on color Doppler
imaging with normal direction of blood flow towards the liver.

IVC: No abnormality visualized.

Pancreas: Visualized portion unremarkable.

Spleen: Size and appearance within normal limits.

Right Kidney: Length: 6.7 cm. Echogenicity within normal limits. No
mass or hydronephrosis visualized.

Left Kidney: Length: 10.6 cm. Echogenicity within normal limits. No
mass or hydronephrosis visualized.

Abdominal aorta: No aneurysm visualized.

Other findings: None.
IMPRESSION: Unremarkable abdominal ultrasound.

## 2022-06-27 ENCOUNTER — Encounter: Payer: Self-pay | Admitting: Family Medicine

## 2023-06-13 ENCOUNTER — Encounter: Payer: Self-pay | Admitting: Family Medicine

## 2023-06-13 ENCOUNTER — Other Ambulatory Visit (INDEPENDENT_AMBULATORY_CARE_PROVIDER_SITE_OTHER): Payer: BC Managed Care – PPO

## 2023-06-13 ENCOUNTER — Ambulatory Visit: Payer: BC Managed Care – PPO | Admitting: Family Medicine

## 2023-06-13 VITALS — BP 118/64 | HR 68 | Temp 98.0°F | Ht 64.0 in | Wt 158.4 lb

## 2023-06-13 DIAGNOSIS — H66002 Acute suppurative otitis media without spontaneous rupture of ear drum, left ear: Secondary | ICD-10-CM | POA: Diagnosis not present

## 2023-06-13 DIAGNOSIS — R051 Acute cough: Secondary | ICD-10-CM

## 2023-06-13 LAB — POC INFLUENZA A&B (BINAX/QUICKVUE)
Influenza A, POC: NEGATIVE
Influenza B, POC: NEGATIVE

## 2023-06-13 LAB — POC COVID19 BINAXNOW: SARS Coronavirus 2 Ag: NEGATIVE

## 2023-06-13 MED ORDER — MOMETASONE FUROATE 50 MCG/ACT NA SUSP
2.0000 | Freq: Every day | NASAL | 12 refills | Status: AC
Start: 2023-06-13 — End: ?

## 2023-06-13 MED ORDER — AMOXICILLIN 875 MG PO TABS: 875.0000 mg | ORAL_TABLET | Freq: Two times a day (BID) | ORAL | 0 refills | Status: AC

## 2023-06-13 MED ORDER — ALBUTEROL SULFATE HFA 108 (90 BASE) MCG/ACT IN AERS: 2.0000 | INHALATION_SPRAY | Freq: Four times a day (QID) | RESPIRATORY_TRACT | 0 refills | Status: DC | PRN

## 2023-06-13 NOTE — Patient Instructions (Addendum)
Follow up as needed or as scheduled START the Amoxicillin- twice daily w/ food Drink LOTS of fluids Continue the Dayquil/Nyquil as needed for cough and congestion USE the nasal spray as directed to improve the congestion between nose and ear ADD a daily Claritin or Zyrtec Call with any questions or concerns Hang in there!!

## 2023-06-13 NOTE — Progress Notes (Signed)
   Subjective:    Patient ID: Alexis Herman, female    DOB: 04-11-1971, 53 y.o.   MRN: [id number]  HPI URI- sxs started Monday and worsened on Tuesday.  + chest congestion, cough, L ear pain.  No fever or body aches.  + HA.  Taking Dayquil/Nyquil w/o relief.  Cough is not productive but wet.  No known sick contacts.   Review of Systems For ROS see HPI     Objective:   Physical Exam Vitals reviewed.  Constitutional:      General: She is not in acute distress.    Appearance: Normal appearance. She is not ill-appearing.  HENT:     Head: Normocephalic and atraumatic.     Right Ear: Tympanic membrane and ear canal normal.     Left Ear: A middle ear effusion is present. Tympanic membrane is erythematous.     Nose: Congestion present.     Right Sinus: No maxillary sinus tenderness or frontal sinus tenderness.     Left Sinus: No maxillary sinus tenderness or frontal sinus tenderness.  Eyes:     Extraocular Movements: Extraocular movements intact.     Conjunctiva/sclera: Conjunctivae normal.  Cardiovascular:     Rate and Rhythm: Normal rate and regular rhythm.  Pulmonary:     Effort: Pulmonary effort is normal. No respiratory distress.     Breath sounds: No wheezing or rhonchi.     Comments: + cough Musculoskeletal:     Cervical back: Normal range of motion.  Lymphadenopathy:     Cervical: No cervical adenopathy.  Skin:    General: Skin is warm and dry.  Neurological:     General: No focal deficit present.     Mental Status: She is alert and oriented to person, place, and time.  Psychiatric:        Mood and Affect: Mood normal.        Behavior: Behavior normal.        Thought Content: Thought content normal.           Assessment & Plan:  L OM- new.  Pt w/ likely viral illness that caused eustachian tube dysfxn and now has L otitis media.  Start Amoxicillin BID.  Reviewed supportive care and red flags that should prompt return.  Pt expressed understanding and is in  agreement w/ plan.

## 2023-06-18 ENCOUNTER — Encounter: Payer: Self-pay | Admitting: Family Medicine

## 2023-06-20 ENCOUNTER — Encounter: Payer: Self-pay | Admitting: Family Medicine

## 2023-06-20 MED ORDER — PREDNISONE 10 MG PO TABS: ORAL_TABLET | ORAL | 0 refills | Status: AC

## 2023-06-20 NOTE — Telephone Encounter (Signed)
Patient notes still not improved much since last week and asking if there is more she can do

## 2023-06-20 NOTE — Addendum Note (Signed)
Addended by: Sheliah Hatch on: 06/20/2023 01:38 PM   Modules accepted: Orders

## 2023-07-05 ENCOUNTER — Other Ambulatory Visit: Payer: Self-pay | Admitting: Family Medicine

## 2023-08-02 LAB — HM COLONOSCOPY

## 2023-10-17 ENCOUNTER — Other Ambulatory Visit: Payer: Self-pay | Admitting: Family Medicine

## 2023-11-10 ENCOUNTER — Other Ambulatory Visit: Payer: Self-pay | Admitting: Family Medicine

## 2024-03-14 ENCOUNTER — Other Ambulatory Visit: Payer: Self-pay | Admitting: Family Medicine

## 2024-04-17 ENCOUNTER — Other Ambulatory Visit: Payer: Self-pay | Admitting: Family Medicine
